# Patient Record
Sex: Female | Born: 1970 | Race: White | Hispanic: Yes | Marital: Married | Smoking: Never smoker
Health system: Southern US, Community
[De-identification: ages and names within clinical notes are randomized; demographics above are authoritative.]

## PROBLEM LIST (undated history)

## (undated) DIAGNOSIS — K219 Gastro-esophageal reflux disease without esophagitis: Secondary | ICD-10-CM

## (undated) DIAGNOSIS — G35 Multiple sclerosis: Secondary | ICD-10-CM

## (undated) DIAGNOSIS — H539 Unspecified visual disturbance: Secondary | ICD-10-CM

## (undated) DIAGNOSIS — R519 Headache, unspecified: Secondary | ICD-10-CM

## (undated) DIAGNOSIS — I10 Essential (primary) hypertension: Secondary | ICD-10-CM

## (undated) DIAGNOSIS — E7800 Pure hypercholesterolemia, unspecified: Secondary | ICD-10-CM

## (undated) DIAGNOSIS — R51 Headache: Secondary | ICD-10-CM

## (undated) HISTORY — DX: Unspecified visual disturbance: H53.9

## (undated) HISTORY — DX: Headache, unspecified: R51.9

## (undated) HISTORY — DX: Essential (primary) hypertension: I10

## (undated) HISTORY — DX: Headache: R51

## (undated) HISTORY — DX: Multiple sclerosis: G35

---

## 2005-06-25 ENCOUNTER — Ambulatory Visit (HOSPITAL_COMMUNITY): Admission: AD | Admit: 2005-06-25 | Discharge: 2005-06-25 | Payer: Self-pay | Admitting: Obstetrics and Gynecology

## 2005-06-26 ENCOUNTER — Inpatient Hospital Stay (HOSPITAL_COMMUNITY): Admission: AD | Admit: 2005-06-26 | Discharge: 2005-06-27 | Payer: Self-pay | Admitting: Obstetrics and Gynecology

## 2007-09-22 ENCOUNTER — Inpatient Hospital Stay (HOSPITAL_COMMUNITY): Admission: EM | Admit: 2007-09-22 | Discharge: 2007-09-25 | Payer: Self-pay | Admitting: Emergency Medicine

## 2007-09-23 ENCOUNTER — Encounter (INDEPENDENT_AMBULATORY_CARE_PROVIDER_SITE_OTHER): Payer: Self-pay | Admitting: *Deleted

## 2008-07-26 ENCOUNTER — Other Ambulatory Visit: Admission: RE | Admit: 2008-07-26 | Discharge: 2008-07-26 | Payer: Self-pay | Admitting: Obstetrics and Gynecology

## 2008-09-19 ENCOUNTER — Ambulatory Visit (HOSPITAL_COMMUNITY): Admission: RE | Admit: 2008-09-19 | Discharge: 2008-09-19 | Payer: Self-pay | Admitting: Obstetrics & Gynecology

## 2008-10-17 ENCOUNTER — Ambulatory Visit (HOSPITAL_COMMUNITY): Admission: RE | Admit: 2008-10-17 | Discharge: 2008-10-17 | Payer: Self-pay | Admitting: Obstetrics & Gynecology

## 2008-11-28 ENCOUNTER — Ambulatory Visit (HOSPITAL_COMMUNITY): Admission: RE | Admit: 2008-11-28 | Discharge: 2008-11-28 | Payer: Self-pay | Admitting: Obstetrics & Gynecology

## 2009-01-09 ENCOUNTER — Ambulatory Visit (HOSPITAL_COMMUNITY): Admission: RE | Admit: 2009-01-09 | Discharge: 2009-01-09 | Payer: Self-pay | Admitting: Obstetrics & Gynecology

## 2009-02-16 ENCOUNTER — Ambulatory Visit (HOSPITAL_COMMUNITY): Admission: RE | Admit: 2009-02-16 | Discharge: 2009-02-16 | Payer: Self-pay | Admitting: Obstetrics & Gynecology

## 2009-03-22 ENCOUNTER — Inpatient Hospital Stay (HOSPITAL_COMMUNITY): Admission: AD | Admit: 2009-03-22 | Discharge: 2009-03-25 | Payer: Self-pay | Admitting: Obstetrics & Gynecology

## 2009-07-10 ENCOUNTER — Ambulatory Visit (HOSPITAL_COMMUNITY): Payer: Self-pay | Admitting: Neurology

## 2009-07-10 ENCOUNTER — Encounter (HOSPITAL_COMMUNITY): Admission: RE | Admit: 2009-07-10 | Discharge: 2009-08-02 | Payer: Self-pay | Admitting: Neurology

## 2010-08-26 ENCOUNTER — Encounter: Payer: Self-pay | Admitting: Obstetrics & Gynecology

## 2010-09-18 ENCOUNTER — Encounter (HOSPITAL_COMMUNITY): Payer: 59 | Attending: Oncology

## 2010-09-18 ENCOUNTER — Ambulatory Visit (HOSPITAL_COMMUNITY): Payer: Self-pay

## 2010-09-18 ENCOUNTER — Ambulatory Visit (HOSPITAL_COMMUNITY): Payer: 59

## 2010-09-18 DIAGNOSIS — G35 Multiple sclerosis: Secondary | ICD-10-CM | POA: Insufficient documentation

## 2010-09-19 ENCOUNTER — Ambulatory Visit (HOSPITAL_COMMUNITY): Payer: 59

## 2010-09-20 ENCOUNTER — Ambulatory Visit (HOSPITAL_COMMUNITY): Payer: 59

## 2010-11-10 LAB — CBC
HCT: 37.8 % (ref 36.0–46.0)
Hemoglobin: 12.5 g/dL (ref 12.0–15.0)
MCHC: 33.1 g/dL (ref 30.0–36.0)
MCV: 89.4 fL (ref 78.0–100.0)
Platelets: 232 10*3/uL (ref 150–400)
RBC: 4.23 MIL/uL (ref 3.87–5.11)
RDW: 15.5 % (ref 11.5–15.5)
WBC: 12.6 10*3/uL — ABNORMAL HIGH (ref 4.0–10.5)

## 2010-11-10 LAB — RPR: RPR Ser Ql: NONREACTIVE

## 2010-12-18 NOTE — H&P (Signed)
[name], [name]               ACCOUNT NO.:  [id number]   MEDICAL RECORD NO.:  [id number]          PATIENT TYPE:  INP   LOCATION:  A201                          FACILITY:  [facility]   PHYSICIAN:  Mobolaji B. Bakare, M.D.DATE OF BIRTH:  04-26-1971   DATE OF ADMISSION:  09/22/2007  DATE OF DISCHARGE:  LH                              HISTORY & PHYSICAL   PRIMARY CARE PHYSICIAN:  Unassigned.   CHIEF COMPLAINT:  Right upper extremity weakness and numbness.   HISTORY OF PRESENTING COMPLAINT:  [name] is a 40 year old Spanish-  speaking lady.  History was obtained via the patient's husband which is  acceptable to the patient.   The patient was in her usual state of health.  She has no significant  past medical history nor is she on any chronic medications.  She was in  her usual state of health until last night about 11:00 p.m. when she  noted that her right hand was numb and felt to her face to be funny.  She did not think much of it.  She went to bed and slept. She woke up  feeling fine, but, while brushing her teeth, she noticed that the right  side of her face was not working properly.  As the morning progressed,  she again noticed numbness and weakness on the right upper extremity.  The face became numb as well. She had a sensation of pins and needles on  her right upper extremities.  She did not have any problem with her  lower extremity.  The patient, therefore, was brought to the emergency  room for evaluation. She had a CT scan of the head which showed left  basal ganglia 8 x 4 mm probable lacunar infarct, small area of subtle  microvascular subcortical ischemia, left centrum semiovale.  Initial  laboratory data were all within normal also including urine drug screen.   The patient's husband stated that she had similar symptoms about 2 years  ago which involved the right side of her face with numbness and  tingling, and this went away after 10 minutes.   REVIEW OF SYSTEMS:   She had she denies cough, fever, chills, shortness  of breath, chest pain, abdominal pain, nausea, vomiting, diarrhea.   PAST MEDICAL HISTORY:  None.   PAST SURGICAL HISTORY:  None.   FAMILY HISTORY:  Significant for hypertension.  No family history of  stroke.   SOCIAL HISTORY:  She does not smoke cigarettes nor drink alcohol.  She  denies drug abuse.   OB-GYN HISTORY:  She is para 5.  Last delivery was in November 2006.   CURRENT MEDICATIONS:  None.   ALLERGIES:  No known drug allergies.   PHYSICAL EXAMINATION:  VITAL SIGNS:  Initial temperature 97.8, blood  pressure 131/86, pulse of 89, respiratory rate of 18.  O2 saturation  100%.  NEUROLOGIC: On examination, the patient is awake, alert but not  communicating due to language barrier.  She appears removed from  environment.  There is no facial asymmetry.  She was able to follow  commands with instructions. She appears to  have what looks like  carpopedal spasm involving her right hand.  (Calcium is within normal  limits.)  She has no Chvostek sign.  NECK:  No elevated JVD.  No carotid bruits.  HEENT:  Mucous membranes moist.  No oral thrush.  LUNGS:  Clear clinically to auscultation.  CARDIOVASCULAR:  S1-S2 regular.  No murmur or gallop.  ABDOMEN:  Not distended, soft, nontender.  Bowel sounds present.  EXTREMITIES: No pedal edema or calf tenderness.  CNS:  The patient actually has no pronator drift.  However, there is  weakness of right upper extremity at 4-/5.  Remain extremities  5/5 in  all other limbs.  Cranial nerves II-XII intact except for diminished  sensation on right trigeminal nerve.  Ambulation: The patient has no  gait. Sensation:  Diminished sensation right upper extremity.  Coordination is intact.  MUSCULOSKELETAL:  No abnormalities.  SKIN: No rash or petechiae.   INITIAL LABORATORY DATA:  White cells 9.2, hemoglobin 13.2, platelets  324.  PT 12.6, INR 0.9, PTT 30.  Alcohol level less than 5.  Sodium  137,  potassium 4.0, chloride 104, CO2 22, glucose 95, BUN 11, creatinine  0.54, calcium 9.2. Pregnancy test negative.  Urine drug screen:  None  detected.  Urinalysis unremarkable.  Microscopy within normal limits.   ASSESSMENT AND PLAN:  [name] is a 40 year old Spanish-speaking lady  presenting with right upper extremity weakness and numbness, right  facial numbness.  She has no significant past medical history.  Husband  reported similar episode of right facial numbness about 2 years ago.   ADMISSION DIAGNOSIS:  Right upper extremity weakness and numbness, right  facial numbness. Rule out ischemic stroke. Rule out vasculitis. Rule out  multiple sclerosis. Brain lesion not seen on CT scan   PLAN:  Obtain MRI of the brain, MRA head and neck, carotid Dopplers, 2-D  echocardiogram, fasting lipid profile, hemoglobin A1c, homocysteine  level. Checked antinuclear antibody (ANA), sed rate and C-reactive  protein.  If  ANA is positive, will proceed with checking SLE panel.  Will monitor on telemetry.  Start aspirin 325 mg p.o. daily.  Will  obtain neurology consult. Will ask PT and OT to evaluate.  Will do  bedside swallowing evaluation. If she fails, will proceed with formal  swallowing evaluation by speech therapist.      Mobolaji B. Corky Downs, M.D.  Electronically Signed     MBB/MEDQ  D:  09/22/2007  T:  09/22/2007  Job:  [id number]

## 2010-12-18 NOTE — Procedures (Signed)
NAMELUCI, BELLUCCI               ACCOUNT NO.:  [id number]   MEDICAL RECORD NO.:  [id number]          PATIENT TYPE:  INP   LOCATION:  A201                          FACILITY:  [facility]   PHYSICIAN:  Dani Gobble, MD       DATE OF BIRTH:  08/26/1970   DATE OF PROCEDURE:  09/23/2007  DATE OF DISCHARGE:                                ECHOCARDIOGRAM   INDICATIONS:  A 40 year old patient admitted with a stroke.   The technical quality of the study is somewhat limited particularly from  the apical views.   The aorta appears grossly normal in size.   The left atrium subjectively is at the upper limits of normal in size.  The patient appeared to be in sinus rhythm during this procedure.   Interventricular septum and posterior wall are within normal limits in  thickness.   The aortic valve leaflets themselves are not well visualized but overall  leaflet excursion appeared normal.  No aortic insufficiency was noted.  Doppler interrogation aortic valve is within normal limits.   The mitral valve appeared structurally normal.  Mild mitral  regurgitation is noted.  Doppler interrogation of the mitral valve is  within normal limits.  No mitral valve prolapse is noted.   The pulmonic valve appeared grossly structurally normal.   The tricuspid valve also appeared grossly structurally normal with mild  to moderate tricuspid regurgitation noted.   The left ventricle was normal in size with LVIDD measured at 4.56 cm,  LVIC measured 3.6 cm.  Overall left ventricular systolic function was  diminished with an estimated ejection fraction of 40-50%.  There  appeared to be mild to moderate global hypokinesis with additional  moderate to severe mid to distal lateral wall hypokinesis.  The apical  wall motion was not well appreciated.   The right atrium and right ventricle are normal in size and right  ventricular systolic function was normal.  No obvious clots or masses  were appreciated but I cannot  exclude the possibility on this study.   IMPRESSION:  1. Left atrium is at the upper limits of normal.  2. Mild mitral and mild to moderate tricuspid regurgitation.  3. Normal left ventricular size with mild to moderate decrease in      ejection fraction estimated at 40-50% with mild      global hypokinesis and moderate to severe mid to distal lateral      wall hypokinesis.  4. No obvious clots or masses were appreciated and no obvious embolic      source, but I cannot exclude the possibility on this study.           ______________________________  Dani Gobble, MD     AB/MEDQ  D:  09/23/2007  T:  09/24/2007  Job:  [id number]

## 2010-12-18 NOTE — Discharge Summary (Signed)
NAMESKYLLAR, [name]               ACCOUNT NO.:  [id number]   MEDICAL RECORD NO.:  [id number]          PATIENT TYPE:  INP   LOCATION:  A201                          FACILITY:  [facility]   PHYSICIAN:  Skeet Latch, DO    DATE OF BIRTH:  08-Nov-1970   DATE OF ADMISSION:  09/22/2007  DATE OF DISCHARGE:  02/20/2009LH                               DISCHARGE SUMMARY   DISCHARGE DIAGNOSES:  1. Probable new onset of multiple sclerosis.  2. Hemiparesis and weakness, that is improving.   BRIEF HOSPITAL COURSE:  This is a 40 year old Hispanic female who  presented with her husband, secondary to language barrier, who has no  significant medical history of her any chronic medications, who  presented with right hand numbness and facial numbness 1 day prior.  The  patient woke up feeling fine.  While brushing her teeth, she noticed the  right side of her face was numb.  As the day went on, she noticed some  numbness and weakness of the right upper extremity and her face.  The  patient had sensation of pins and needles in her face and right upper  extremity.  After that, the patient was brought to the emergency room.  After being seen in the emergency room, a CT scan of her head showed a  left basal ganglia x4 mm parietal lacunar infarct, small areas of subtle  microvascular subcortical ischemia and a left central semiovale.  Carotid Dopplers of her neck showed no significant atherosclerosis.  She  subsequently underwent a MRI of her brain with and without contrast  which showed:  1. Greater than expected number or periventricular and subcortical T2      hyperintensities.  This included a lesion of left corona radiata      which measured 7 x 6-mm maximally.  The finding are nonspecific.      Can be seen in the setting of a demyelinating process.  Such      lesions can seen in setting of vasculitis, as sequelae of prior      infectious inflammatory processes.  2. Showed a 6.3-mm cystic lesion at the  level of the foramina of [place]      with a high signal of FLAIR images.  This is concerning for small      core-like cysts.  3. No evidence of acute or sub-cu infarct.  MRI angiography of her      head showed normal MRA circle of Willis.  MRI angiography of her      neck was unremarkable.  The patient's numbness and weakness has      slowly improved.  The patient had a neurologic consult and      underwent a spinal tap.  Fairly unremarkable findings on her spinal      tap.  The patient was having some muscle spasm was placed on Valium      p.o.  The patient was also placed on Solu-Medrol 1 gram daily x3      IV.  The patient has been doing fairly well and is wanting to go  home at this time.  I spoke with neurology, feel like patient, as      long as she received her Solu-Medrol, she can go home and have      follow-up as an outpatient in one week.   MEDICATIONS ON DISCHARGE:  Include Valium 5 mg 1 tablet p.o. t.i.d.  p.r.n.   PHYSICAL EXAMINATION:  VITALS:  On discharge:  Temperature is 97.9,  pulse 80, respirations 20, blood pressure 117/76.  She is satting 99% on  room air.   LABS:  Sodium 141, potassium 4.0, chloride 108, CO2 is 23, glucose 175,  BUN 20, creatinine 0.63, white count 16.1, hemoglobin 12.9, hematocrit  37.3, platelet count 336.  CSF culture had no organisms.  Cryptococcal  antigen was negative.   CONDITION ON DISCHARGE:  Stable.   DISPOSITION:  The patient be discharged to home with her husband.   DISCHARGE INSTRUCTIONS:  The patient is to maintain a regular diet.  She  is to increase activity slowly.  I do not feel the patient needs  physical therapy at this time. The patient is to follow up with Dr.  Gerilyn Pilgrim in 1 week and with her PCP in the next 1 to 2 weeks.  Of note,  the patient did have slightly-elevated cholesterol.  The patient needs  this monitored, to see if she may need a statin as an outpatient.  Will  defer to her primary care physician, due  to the muscle weakness at this  time.      Skeet Latch, DO  Electronically Signed     SM/MEDQ  D:  09/25/2007  T:  09/26/2007  Job:  [id number]   cc:   Darleen Crocker A. Gerilyn Pilgrim, M.D.  Fax: [phone]

## 2010-12-18 NOTE — Discharge Summary (Signed)
[name], [name]               ACCOUNT NO.:  [id number]   MEDICAL RECORD NO.:  [id number]          PATIENT TYPE:  INP   LOCATION:  9110                          FACILITY:  [facility]   PHYSICIAN:  Tilda Burrow, M.D. DATE OF BIRTH:  04-06-1971   DATE OF ADMISSION:  03/22/2009  DATE OF DISCHARGE:  03/25/2009                               DISCHARGE SUMMARY   ADMITTING DIAGNOSES:  Active labor, pregnancy [redacted] weeks gestation,  history of multiple sclerosis, questionable history of stroke, previous  history of group B strep, positive carrier status, desire for elective  sterilization in hospital.   DISCHARGE DIAGNOSES:  1. Pregnancy 40 weeks, delivered.  2. Multiple sclerosis, stable.  3. Questionable history of previous stroke ruled out, no evidence of      prior stroke found in records, desire for permanent sterilization,      the patient changed mind.  4. Desire for interval long-term contraception with Implanon.   PROCEDURES:  Epidural catheter placement, spontaneous vertex vaginal  delivery, Depo-Provera 150 mg IM prior to discharge.   HOSPITAL SUMMARY:  A 40 year old gravida 6, para 5-0-0-5 with [redacted] weeks  gestation presented with active labor after prenatal course followed at  [facility], and notable for a history of multiple sclerosis.  The patient began the pregnancy on gabapentin 300 mg b.i.d., Valium 10  mg b.i.d., interferon 40 mg 3 times per week, and ibuprofen which was  discontinued early in the pregnancy.  She had a previous history of  being followed by Dr. Beryle Beams for multiple sclerosis.   Pregnancy itself was noted for blood type O positive, antibody screen  negative, rubella immunity present, platelets 354,000, hemoglobin 12,  hematocrit 37.  Hepatitis, HIV, RPR, GC and chlamydia all negative.  MSAFP was declined.  Group B strep test was positive.  Glucose tolerance  test elevated at 153 mg percent with a normal 3-hour glucose tolerance  test Seph[employer]x  negative.  The patient did not follow serial glucoses, but  did not show glucosuria.   HOSPITAL COURSE:  The patient was admitted and proceeded to Labor.  She  progressed from admission to 5 cm at 5:00 a.m. and delivered at 8:00  a.m. of a healthy female infant, Apgars of 9 and 9 under epidural  analgesia without difficulty with 700 mL EBL.   Postpartum questions existed regarding tubal sterilization, which the  patient initially requested and then cancelled at the last minute.   More significantly, the question was raised as to whether the patient  has had a stroke.  She gave a positive verbal history.  Review of her  records was being initiated.  Old records from Dr. Gerilyn Pilgrim were  reviewed.  Dr. Beryle Beams, neurologist of .  She had been  seen in February 2007, by Dr. Gerilyn Pilgrim, at which time the diagnosis of  multiple sclerosis was made due to her presentation that showed facial  numbness, September 21, 2007 with right upper extremity weakness.  The  patient was hospitalized 3 days for this weakness and discharged on Solu-  Medrol and other medicines.  The patient's  old records from the  neurologist were acquired and reviewed to clarify the question of a  stroke.  The patient had, had a sleep study, which she was interpreting  as indicating a stroke.  After a thorough review of discharge summary  and subsequent records, it was determined that we could find no evidence  to document a history of stroke, and therefore, she was not placed on  thromboprophylaxis.  At the time of discharge, it has been extensively  emphasized to the patient the importance of proper followup with Dr.  Gerilyn Pilgrim in order to restart multiple sclerosis medications.  No  anticoagulation will be used at this time given the negative history of  stroke based on thorough records review.  The patient is asked to follow  up by [facility] in 4 weeks.  She will receive Depo-Provera  prior to discharge  for contraception and will be asked to see Dr.  Gerilyn Pilgrim this week to be reassessed for consideration of restarting her  multiple sclerosis medicines, which were discontinued for the duration  of the pregnancy.      Tilda Burrow, M.D.  Electronically Signed     JVF/MEDQ  D:  03/25/2009  T:  03/26/2009  Job:  [id number]   cc:   Darleen Crocker A. Gerilyn Pilgrim, M.D.  Fax: [phone]   [facility]

## 2010-12-18 NOTE — Group Therapy Note (Signed)
NAMEJOYCELIN, RADLOFF               ACCOUNT NO.:  [id number]   MEDICAL RECORD NO.:  [id number]          PATIENT TYPE:  INP   LOCATION:  A201                          FACILITY:  [facility]   PHYSICIAN:  Skeet Latch, DO    DATE OF BIRTH:  [birth year]   DATE OF PROCEDURE:  09/24/2007  DATE OF DISCHARGE:                                 PROGRESS NOTE   SUBJECTIVE:  [name] speaks Spanish, but apparently the patient is  doing well.  The patient just has had a spinal tap done by neurology  recently.  The patient seems to be awake and alert, not complaining of  any headaches and seems to be doing well at this point.   OBJECTIVE:  VITAL SIGNS:  Temperature is 97.3, pulse 85, respirations  16, blood pressure 106/75, satting 98% on room air.   PHYSICAL EXAM:  CARDIOVASCULAR:  Regular rate and rhythm.  No rubs,  gallops, murmurs.  LUNGS:  Clear to auscultation bilaterally.  No rales, rhonchi or  wheezing.  ABDOMEN:  Soft, nontender, nondistended.  No rigidity or guarding.  EXTREMITIES:  No clubbing, cyanosis or edema.  NEUROLOGIC:  She still has slight weakness to the right upper extremity,  but overall seems to be doing well. Still has some decreased sensation.   LABORATORY DATA:  Her cell count differential from a spinal tap: Color  is colorless. She had 4 WBCs, 66 RBCs. Her culture of CSF is pending.  Glucose was 88.  Her CSF total protein was 21, homocystine level was  5.9, sodium 139, potassium 3.9, chloride 108, CO2 21, glucose 137, BUN  19, creatinine 0.61, white count 10.71, hemoglobin 13.7, hematocrit  39.5, platelets 331.   ASSESSMENT/PLAN:  Probable multiple sclerosis. The patient just received  a spinal tap.  Will await neurology review of her spinal tap results at  this time.  At this time, will continue current treatment.  Will  continue to follow.  Await neurology recommendations status post her  spinal tap.      Skeet Latch, DO  Electronically Signed     SM/MEDQ   D:  09/24/2007  T:  09/24/2007  Job:  [id number]

## 2010-12-18 NOTE — Consult Note (Signed)
[name], [name]               ACCOUNT NO.:  [id number]   MEDICAL RECORD NO.:  [id number]          PATIENT TYPE:  INP   LOCATION:  A201                          FACILITY:  [facility]   PHYSICIAN:  Kofi A. Gerilyn Pilgrim, M.D. DATE OF BIRTH:  [birth year]   DATE OF CONSULTATION:  DATE OF DISCHARGE:                                 CONSULTATION   REASON FOR CONSULTATION:  Right-sided weakness.   The patient is a 40 year old Mexican-American female who presents with  the acute onset of right facial numbness and right upper extremity  numbness.  She also had weakness and what appears to be flexor spasms of  the right hand.  The patient's initial reports about this was that this  was the first time this has happened, but the husband now indicates that  about 2-3 years ago she had a similar event.  This time, the event  seemed to have lasted much longer, as she is now 1-2 days into the  current event.  The first event was about three years ago and lasted  about 10 minutes, and apparently was precipitated by psychosocial  stressors.  She does not report having symptoms involving the left side.  She also does endorse having some blurred vision on the right with the  right facial numbness.   PAST MEDICAL HISTORY:  The patient has essentially been relatively  healthy.   ADMISSION MEDICATIONS:  None.   SOCIAL HISTORY:  Married.   REVIEW OF SYSTEMS:  As stated in history of present illness, otherwise  unrevealing.   PHYSICAL EXAMINATION:  A pleasant lady, anxious-appearing, no acute  distress.  Temperature 97.9, pulse 85, respirations 20, blood pressure 113/71.  HEENT:  Head is normocephalic and atraumatic.  NECK:  Supple.  ABDOMEN:  Soft.  EXTREMITIES:  No significant edema.  MENTATION:  The patient is awake and alert.  She does follow commands.  She speaks very little [place].  Cranial nerve evaluation:  Pupils are  equal, round and reactive to light and accommodation.  Extraocular  movements  are intact.  Facial muscle strength is symmetric.  Tongue is  midline.  Uvula is midline.  Shoulder shrug is normal on the left but  impaired on the right.  Motor examination shows 4-5 weakness involving  the proximal right upper extremity, 3-5 involving the distal right upper  extremity.  The hand is contracted and has increased tone.  Normal  strength in the right leg, normal strength in the left side.  Sensation  is normal to light touch and temperature, as far as we can tell,  although there is some limitation with language, but the husband, who  interprets, states that she does not report any impaired sensation.  Reflexes are symmetric, although plantar reflexes are upgoing on the  right side.  The reflexes appear to be normal to slightly brisk.  Coordination indicates no dysmetrias or tremors.   MRI of the brain was reviewed in person and shows multiple scattered  white matter plaques.  She has several plaques, especially involving the  left hemisphere that are perpendicular to the corpus callosum.  She has  more plaques involving the left hemisphere.  The plaques are counted,  and she has approximately 20 different lesions.  MRA of the brain and  carotids are negative.  She does have an incidental fluid-filled lesion  that is at the top of the third ventricle, that is undoubtedly a colloid  cyst.  This is an incidental finding.   LABORATORY EVALUATION:  Sed rate 20.  Urinalysis negative.  Urine drug  screen negative.  Ethanol level negative.  WBC 9.2, hemoglobin 13,  platelet count 324.  Sodium 137, potassium 4, chloride 104, CO2 22, BUN  11, creatinine 0.5, glucose 95, calcium 9.2.   IMPRESSION:  1. Acute onset of left hemiparesis and spasms with multiple white      matter lesions.  The most likely diagnosis is new onset of multiple      sclerosis.  She actually had similar event three years ago but      lasted only 10 minutes.  Other differentials are unlikely,      including  vasculitis, migraines, and chronic ischemia.  There is      nothing acute seen on diffusion imaging to indicate an acute      infarct.  2. Incidental colloid cyst.   RECOMMENDATIONS:  1. Solu-Medrol per the typical protocol, 1 gm daily x3 days.  2. Spinal tap.  3. Will start her on Valium to help with the spasms.  4. She had some additional labs which were ordered, and those will be      checked.  Some of those labs include ANA.   Thanks for this consultation.      Kofi A. Gerilyn Pilgrim, M.D.  Electronically Signed     KAD/MEDQ  D:  09/23/2007  T:  09/23/2007  Job:  [id number]

## 2010-12-18 NOTE — Group Therapy Note (Signed)
[name], OSWALD               ACCOUNT NO.:  [id number]   MEDICAL RECORD NO.:  [id number]          PATIENT TYPE:  INP   LOCATION:  A201                          FACILITY:  [facility]   PHYSICIAN:  Skeet Latch, DO    DATE OF BIRTH:  [birth year]   DATE OF PROCEDURE:  09/23/2007  DATE OF DISCHARGE:                                 PROGRESS NOTE   SUBJECTIVE:  The patient seems to be slightly improved today.  The  patient continues to have a right upper extremity weakness and numbness  but secondary to language barrier, unsure if it is improved.  Husband  seems to think that this has improved since yesterday, but only  slightly.   OBJECTIVE:  VITAL SIGNS:  Temperature is 98.3, pulse 77, respirations  18, blood pressure 103/55, sating 98% on room air.  GENERAL:  The patient is awake and alert.  No acute distress.  Seems to  be doing well.  LUNGS:  Clear to auscultation bilaterally.  No rales, rhonchi or  wheezing.  CARDIOVASCULAR:  Normal rate and rhythm.  No rubs, gallops or murmurs.  ABDOMEN:  Soft, nontender, nondistended.  Positive bowel sounds.  EXTREMITIES:  No clubbing, cyanosis or edema.  NEUROLOGIC:  She has some weakness in her right upper extremity.  She is  +4/5.  Cranial nerves II-XII seem grossly intact.  She has some  decreased sensation in the right trigeminal nerve distribution.  The  patient is lying in bed, could not get an assessment of her gait.  SKIN:  No obvious rashes or pruritus is noted.   LABORATORY DATA:  Lipid panel:  Cholesterol 218, triglycerides 124, HDL  56, LDL 137.  Sed rate is 20.   Chest x-ray showed linear submental atelectasis at the left lung base,  otherwise unremarkable.   ASSESSMENT/PLAN:  Probable multiple sclerosis.  The patient being  followed by neurology.  Incidental __________ cyst was also found.  Neurology plans on doing a spinal tap.  The patient will continue on IV  Solu-Medrol and has been given Valium for any spasms.  Carotid   Dopplers  and 2-D echo are pending at this time.  Will continue to follow the  patient very closely.      Skeet Latch, DO  Electronically Signed     SM/MEDQ  D:  09/23/2007  T:  09/23/2007  Job:  [id number]

## 2010-12-21 NOTE — H&P (Signed)
NAMEMARGRETTE, [name]               ACCOUNT NO.:  [id number]   MEDICAL RECORD NO.:  [id number]          PATIENT TYPE:  INP   LOCATION:  A412                          FACILITY:  [facility]   PHYSICIAN:  Tilda Burrow, M.D. DATE OF BIRTH:  1971-08-02   DATE OF ADMISSION:  DATE OF DISCHARGE:  LH                                HISTORY & PHYSICAL   ADMITTING DIAGNOSIS:  Pregnancy, 40 weeks, 1 day, early labor.   HISTORY OF PRESENT ILLNESS:  This 40 year old Hispanic female, gravida 5,  para 4, four living children, LMP September 18, 2004, [facility] June 25, 2005,  with corresponding second and first trimester ultrasounds, who is admitted  at 40 weeks, 1 day, after presenting to the office in 12 hours of mild  discomfort with exam showing the cervix to be 4 cm, 20% effaced, very  posteriorly oriented, vertex presentation, well applied to the lower uterine  segment.  There is no bleeding. The membranes appear to be intact.  Yesterday, she has a non-stress test that was reactive.  She was advised to  present to labor and delivery for probable vaginal delivery.  She plans for  this to be her last child.  Final contraception plans, IUD versus pills  versus tubal, have not been finalized.   PAST MEDICAL HISTORY:  Benign surgical history, negative allergies known.   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   PRENATAL LABORATORY DATA:  Blood type 0 positive.  Urine drug screen  negative.  Hemoglobin 13, hematocrit 42.  Hepatitis, HIV negative. RPR  nonreactive. GC and Chlamydia negative.  MSAFP negative at 1:1900 risk of  Down syndrome.  Glucose tolerance 100 mg%.  Her husband, Kern Alberta, is with her  and speaks good [place].  Prenatal course has been notable for severe  urticarial reaction of pregnancy involving hands, forearms, and face during  July and August, improved with 1% hydrocortisone.   PHYSICAL EXAMINATION:   GENERAL:  Weight 169, blood pressure 118/80, term  size fetus, vertex presentation, cervix 4 cm, 20%, -2, vertex posteriorly  oriented cervix.   PLAN:  Allow to labor spontaneously, IV analgesics.      Tilda Burrow, M.D.  Electronically Signed     JVF/MEDQ  D:  06/26/2005  T:  06/26/2005  Job:  [id number]

## 2010-12-21 NOTE — H&P (Signed)
NAMEJASMINEMARIE, [name]               ACCOUNT NO.:  [id number]   MEDICAL RECORD NO.:  [id number]          PATIENT TYPE:  INP   LOCATION:  LDR4                          FACILITY:  [facility]   PHYSICIAN:  Tilda Burrow, M.D. DATE OF BIRTH:  [birth year]   DATE OF ADMISSION:  06/26/2005  DATE OF DISCHARGE:  LH                                HISTORY & PHYSICAL   ADMISSION DIAGNOSES:  Pregnancy at 40 weeks and 1 day in active labor.   HISTORY OF PRESENT ILLNESS:  Ms. Sandi Mariscal presented to the office complaining  of irregular uterine contractions. Yesterday, the patient's cervix was  closed. Today, she is 4 cm and irregular contractions were noted.   PAST MEDICAL HISTORY:  Negative.   PAST SURGICAL HISTORY:  Negative.   ALLERGIES:  No known drug allergies.   PRENATAL COURSE:  Uneventful. Blood type O positive. UDS negative. Rubella  is immune. Hepatitis B surface antigen negative. HIV is nonreactive. HSV  type 1 is positive, type 2 negative. Serologies nonreactive. Pap is normal.  GC and chlamydia were negative and repeats were also negative. A 28-week  hemoglobin 11.4, 28-week hematocrit 35.7. One-hour glucose 100. Sickle cell  screen negative.   PHYSICAL EXAMINATION:  Vital signs are stable. The patient is 4 cm, vertex  presentation is noted, 80% effaced, -1 station.   PLAN:  Admit. Expect vaginal delivery.      Zerita Boers, Lanier Clam      Tilda Burrow, M.D.  Electronically Signed    DL/MEDQ  D:  16/05/9603  T:  06/26/2005  Job:  [id number]   cc:   [facility]

## 2010-12-21 NOTE — Op Note (Signed)
[name], [name]               ACCOUNT NO.:  [id number]   MEDICAL RECORD NO.:  [id number]          PATIENT TYPE:  INP   LOCATION:  LDR4                          FACILITY:  [facility]   PHYSICIAN:  Tilda Burrow, M.D. DATE OF BIRTH:  March 26, 1971   DATE OF PROCEDURE:  DATE OF DISCHARGE:                                  PROCEDURE NOTE   DELIVERY SUMMARY:   ONSET OF LABOR:  June 26, 2005 at 9 a.m.   DATE OF DELIVERY:  June 26, 2005 at 1546 p.m.   LENGTH OF FIRST STAGE OF LABOR:  6 hours, 26 minutes.   LENGTH OF SECOND STAGE OF LABOR:  20 minutes.   LENGTH OF THIRD STAGE OF LABOR:  8 minutes.   DELIVERY NOTE:  [name] had a normal spontaneous vaginal delivery of a viable  female infant.  On delivery of head, there was noted an __Nuchal cord x 1__  which was easily reduced, and the infant delivered spontaneously without  difficulty.  Upon delivery, the infant was thoroughly suctioned, dried, cord  clamped and cut, and passed off the nurses for newborn care.  Apgars were  9/9.  Upon inspection, there was a second degree perineal laceration noted,  which was infiltrated with 20 cc of 1% lidocaine and repaired with 2-0  Vicryl with 2 interrupted sutures and subcutaneous to close.  The third  stage of labor was actually managed with 20 units of Pitocin and a 1000 cc  of __Lactated Ringers solution___ at a rapid rate.  Placenta was delivered  spontaneously via Schultz's mechanism.  Upon inspection, there was a three vessel cord noted, and membranes were  noted to be intact.  Estimated blood loss approximately 300 cc.  The infant  and motor were both stabilized and transferred out to the postpartum unit in  stable condition.      Zerita Boers, Lanier Clam      Tilda Burrow, M.D.  Electronically Signed    DL/MEDQ  D:  16/05/9603  T:  06/26/2005  Job:  [id number]   cc:   Jeoffrey Massed, MD  Fax: [phone]

## 2011-03-11 ENCOUNTER — Other Ambulatory Visit: Payer: Self-pay | Admitting: Neurology

## 2011-03-11 DIAGNOSIS — R531 Weakness: Secondary | ICD-10-CM

## 2011-03-18 ENCOUNTER — Ambulatory Visit (HOSPITAL_COMMUNITY)
Admission: RE | Admit: 2011-03-18 | Discharge: 2011-03-18 | Disposition: A | Discharge status: Home / Self Care | Payer: 59 | Source: Ambulatory Visit | Attending: Neurology | Admitting: Neurology

## 2011-03-18 DIAGNOSIS — R209 Unspecified disturbances of skin sensation: Secondary | ICD-10-CM | POA: Insufficient documentation

## 2011-03-18 DIAGNOSIS — G35 Multiple sclerosis: Secondary | ICD-10-CM | POA: Insufficient documentation

## 2011-03-18 DIAGNOSIS — M6281 Muscle weakness (generalized): Secondary | ICD-10-CM | POA: Insufficient documentation

## 2011-03-18 DIAGNOSIS — R531 Weakness: Secondary | ICD-10-CM

## 2011-03-18 MED ORDER — GADOBENATE DIMEGLUMINE 529 MG/ML IV SOLN: 15.0000 mL | Freq: Once | INTRAVENOUS | Status: AC | PRN

## 2011-04-26 LAB — LIPID PANEL
Cholesterol: 218 — ABNORMAL HIGH
HDL: 56
LDL Cholesterol: 137 — ABNORMAL HIGH
Total CHOL/HDL Ratio: 3.9
Triglycerides: 124
VLDL: 25

## 2011-04-26 LAB — BASIC METABOLIC PANEL
BUN: 11
BUN: 19
BUN: 20
CO2: 21
CO2: 22
CO2: 23
Calcium: 9.2
Calcium: 9.4
Calcium: 9.6
Chloride: 104
Chloride: 108
Chloride: 108
Creatinine, Ser: 0.54
Creatinine, Ser: 0.61
Creatinine, Ser: 0.63
GFR calc Af Amer: >60
GFR calc Af Amer: >60
GFR calc Af Amer: >60
GFR calc non Af Amer: >60
GFR calc non Af Amer: >60
GFR calc non Af Amer: >60
Glucose, Bld: 137 — ABNORMAL HIGH
Glucose, Bld: 175 — ABNORMAL HIGH
Glucose, Bld: 95
Potassium: 3.9
Potassium: 4
Potassium: 4
Sodium: 137
Sodium: 139
Sodium: 141

## 2011-04-26 LAB — TSH: TSH: 2.547

## 2011-04-26 LAB — URINALYSIS, ROUTINE W REFLEX MICROSCOPIC
Bilirubin Urine: NEGATIVE
Glucose, UA: NEGATIVE
Ketones, ur: NEGATIVE
Leukocytes, UA: NEGATIVE
Nitrite: NEGATIVE
Protein, ur: NEGATIVE
Specific Gravity, Urine: 1.02
Urobilinogen, UA: 0.2
pH: 5.5

## 2011-04-26 LAB — DIFFERENTIAL
Basophils Absolute: 0
Basophils Absolute: 0
Basophils Absolute: 0.1
Basophils Relative: 0
Basophils Relative: 0
Basophils Relative: 1
Eosinophils Absolute: 0
Eosinophils Absolute: 0
Eosinophils Absolute: 0
Eosinophils Relative: 0
Eosinophils Relative: 0
Eosinophils Relative: 1
Lymphocytes Relative: 24
Lymphocytes Relative: 5 — ABNORMAL LOW
Lymphocytes Relative: 8 — ABNORMAL LOW
Lymphs Abs: 0.8
Lymphs Abs: 0.9
Lymphs Abs: 2.2
Monocytes Absolute: 0 — ABNORMAL LOW
Monocytes Absolute: 0.2
Monocytes Absolute: 0.3
Monocytes Relative: 0 — ABNORMAL LOW
Monocytes Relative: 2 — ABNORMAL LOW
Monocytes Relative: 2 — ABNORMAL LOW
Neutro Abs: 14.9 — ABNORMAL HIGH
Neutro Abs: 6.7
Neutro Abs: 9.8 — ABNORMAL HIGH
Neutrophils Relative %: 73
Neutrophils Relative %: 92 — ABNORMAL HIGH
Neutrophils Relative %: 93 — ABNORMAL HIGH

## 2011-04-26 LAB — OLIGOCLONAL BANDS, CSF + SERM
Albumin Index: 1.5 ratio (ref 0.0–9.0)
Albumin, CSF: 6 mg/dL (ref 0–35)
Albumin, Serum(Neph): 4070 mg/dL (ref 3500–5200)
CSF Oligoclonal Bands: NEGATIVE
IgG Index, CSF: 0.58 ratio (ref 0.28–0.66)
IgG, CSF: 1.1 mg/dL (ref 0.0–6.0)
IgG, Serum: 1280 mg/dL (ref 768–1632)
IgG/Albumin Ratio, CSF: 0.18 ratio (ref 0.09–0.25)
MS CNS IgG Synthesis Rate: 0 mg/d (ref 0.0–8.0)

## 2011-04-26 LAB — CBC
HCT: 37.3
HCT: 38.5
HCT: 39.5
Hemoglobin: 12.9
Hemoglobin: 13.2
Hemoglobin: 13.7
MCHC: 34.3
MCHC: 34.6
MCHC: 34.7
MCV: 88.4
MCV: 88.5
MCV: 88.9
Platelets: 324
Platelets: 331
Platelets: 336
RBC: 4.22
RBC: 4.36
RBC: 4.45
RDW: 13.4
RDW: 13.5
RDW: 13.5
WBC: 10.7 — ABNORMAL HIGH
WBC: 16.1 — ABNORMAL HIGH
WBC: 9.2

## 2011-04-26 LAB — RAPID URINE DRUG SCREEN, HOSP PERFORMED
Amphetamines: NOT DETECTED
Barbiturates: NOT DETECTED
Benzodiazepines: NOT DETECTED
Cocaine: NOT DETECTED
Opiates: NOT DETECTED
Tetrahydrocannabinol: NOT DETECTED

## 2011-04-26 LAB — FUNGUS CULTURE W SMEAR: Fungal Smear: NONE SEEN

## 2011-04-26 LAB — HEMOGLOBIN A1C
Hgb A1c MFr Bld: 5.6
Mean Plasma Glucose: 122

## 2011-04-26 LAB — PROTEIN AND GLUCOSE, CSF
Glucose, CSF: 88 — ABNORMAL HIGH
Total  Protein, CSF: 21

## 2011-04-26 LAB — CSF CELL COUNT WITH DIFFERENTIAL
RBC Count, CSF: 56 — ABNORMAL HIGH
Tube #: 4
WBC, CSF: 4

## 2011-04-26 LAB — CSF CULTURE W GRAM STAIN
Culture: NO GROWTH
Gram Stain: NONE SEEN

## 2011-04-26 LAB — PROTIME-INR
INR: 0.9
Prothrombin Time: 12.6

## 2011-04-26 LAB — URINE MICROSCOPIC-ADD ON

## 2011-04-26 LAB — CRYPTOCOCCAL ANTIGEN, CSF: Crypto Ag: NEGATIVE

## 2011-04-26 LAB — ETHANOL: Alcohol, Ethyl (B): <5

## 2011-04-26 LAB — HOMOCYSTEINE: Homocysteine: 5.9

## 2011-04-26 LAB — PREGNANCY, URINE: Preg Test, Ur: NEGATIVE

## 2011-04-26 LAB — ANA: Anti Nuclear Antibody(ANA): NEGATIVE

## 2011-04-26 LAB — APTT: aPTT: 30

## 2011-04-26 LAB — SEDIMENTATION RATE: Sed Rate: 20

## 2011-04-26 LAB — C-REACTIVE PROTEIN: CRP: 2.2 — ABNORMAL HIGH (ref <0.6)

## 2011-06-18 ENCOUNTER — Ambulatory Visit (HOSPITAL_COMMUNITY): Payer: 59

## 2011-06-18 ENCOUNTER — Encounter (HOSPITAL_COMMUNITY): Payer: 59 | Attending: Neurology

## 2011-06-18 DIAGNOSIS — G35 Multiple sclerosis: Secondary | ICD-10-CM

## 2011-06-18 MED ORDER — METHYLPREDNISOLONE SODIUM SUCC 125 MG IJ SOLR: 1000.0000 mg | INTRAMUSCULAR | Status: DC

## 2011-06-18 MED ORDER — SODIUM CHLORIDE 0.9 % IJ SOLN: 10.0000 mL | INTRAMUSCULAR | Status: DC | PRN

## 2011-06-18 MED ORDER — SODIUM CHLORIDE 0.9 % IV SOLN
Freq: Once | INTRAVENOUS | Status: AC
  Administered 2011-06-18: 13:00:00 via INTRAVENOUS
  Filled 2011-06-18: qty 16

## 2011-06-18 MED ORDER — HEPARIN SOD (PORK) LOCK FLUSH 100 UNIT/ML IV SOLN
500.0000 [IU] | Freq: Once | INTRAVENOUS | Status: AC
  Administered 2011-06-18: 200 [IU] via INTRAVENOUS

## 2011-06-18 MED ORDER — SODIUM CHLORIDE 0.9 % IV SOLN
INTRAVENOUS | Status: DC
  Administered 2011-06-18: 13:00:00 via INTRAVENOUS

## 2011-06-18 NOTE — Progress Notes (Signed)
Tolerated infusion well. 

## 2011-06-19 ENCOUNTER — Ambulatory Visit (HOSPITAL_COMMUNITY): Payer: 59

## 2011-06-19 ENCOUNTER — Encounter (HOSPITAL_COMMUNITY): Payer: 59

## 2011-06-19 ENCOUNTER — Encounter (HOSPITAL_COMMUNITY): Payer: Self-pay

## 2011-06-19 DIAGNOSIS — G35 Multiple sclerosis: Secondary | ICD-10-CM | POA: Insufficient documentation

## 2011-06-19 HISTORY — DX: Multiple sclerosis: G35

## 2011-06-19 MED ORDER — HEPARIN SOD (PORK) LOCK FLUSH 100 UNIT/ML IV SOLN
500.0000 [IU] | Freq: Once | INTRAVENOUS | Status: AC
  Administered 2011-06-19: 200 [IU] via INTRAVENOUS

## 2011-06-19 MED ORDER — METHYLPREDNISOLONE SODIUM SUCC 125 MG IJ SOLR: 1000.0000 mg | Freq: Once | INTRAMUSCULAR | Status: DC

## 2011-06-19 MED ORDER — SODIUM CHLORIDE 0.9 % IJ SOLN
10.0000 mL | INTRAMUSCULAR | Status: DC | PRN
  Administered 2011-06-19: 10 mL via INTRAVENOUS

## 2011-06-19 MED ORDER — SODIUM CHLORIDE 0.9 % IV SOLN
Freq: Once | INTRAVENOUS | Status: AC
  Administered 2011-06-19: 14:00:00 via INTRAVENOUS
  Filled 2011-06-19: qty 8

## 2011-06-19 NOTE — Progress Notes (Signed)
Tolerated infusion well. 

## 2011-06-20 ENCOUNTER — Encounter (HOSPITAL_COMMUNITY): Payer: 59

## 2011-06-20 ENCOUNTER — Ambulatory Visit (HOSPITAL_COMMUNITY): Payer: 59

## 2011-06-20 DIAGNOSIS — G35 Multiple sclerosis: Secondary | ICD-10-CM

## 2011-06-20 MED ORDER — METHYLPREDNISOLONE SODIUM SUCC 500 MG IJ SOLR: 1000.0000 mg | INTRAMUSCULAR | Status: DC

## 2011-06-20 MED ORDER — SODIUM CHLORIDE 0.9 % IV SOLN
INTRAVENOUS | Status: DC
  Administered 2011-06-20: 14:00:00 via INTRAVENOUS

## 2011-06-20 MED ORDER — HEPARIN SOD (PORK) LOCK FLUSH 100 UNIT/ML IV SOLN
500.0000 [IU] | Freq: Once | INTRAVENOUS | Status: AC
  Administered 2011-06-20: 500 [IU] via INTRAVENOUS

## 2011-06-20 MED ORDER — SODIUM CHLORIDE 0.9 % IV SOLN
Freq: Once | INTRAVENOUS | Status: AC
  Administered 2011-06-20: 15:00:00 via INTRAVENOUS
  Filled 2011-06-20: qty 8

## 2011-06-20 MED ORDER — SODIUM CHLORIDE 0.9 % IJ SOLN
10.0000 mL | INTRAMUSCULAR | Status: DC | PRN
  Administered 2011-06-20: 10 mL via INTRAVENOUS

## 2011-06-20 NOTE — Progress Notes (Signed)
Tolerated infusion well. 

## 2011-06-21 ENCOUNTER — Encounter (HOSPITAL_COMMUNITY): Payer: 59

## 2011-06-21 ENCOUNTER — Ambulatory Visit (HOSPITAL_COMMUNITY): Payer: 59

## 2011-06-21 VITALS — BP 124/77 | HR 69 | Temp 97.7°F

## 2011-06-21 DIAGNOSIS — G35 Multiple sclerosis: Secondary | ICD-10-CM

## 2011-06-21 MED ORDER — METHYLPREDNISOLONE SODIUM SUCC 125 MG IJ SOLR: 1000.0000 mg | Freq: Once | INTRAMUSCULAR | Status: DC

## 2011-06-21 MED ORDER — SODIUM CHLORIDE 0.9 % IV SOLN
Freq: Once | INTRAVENOUS | Status: AC
  Administered 2011-06-21: 15:00:00 via INTRAVENOUS
  Filled 2011-06-21: qty 8

## 2011-06-21 MED ORDER — SODIUM CHLORIDE 0.9 % IJ SOLN
10.0000 mL | INTRAMUSCULAR | Status: DC | PRN
  Administered 2011-06-21: 10 mL via INTRAVENOUS

## 2011-06-21 MED ORDER — SODIUM CHLORIDE 0.9 % IV SOLN
INTRAVENOUS | Status: DC
  Administered 2011-06-21: 15:00:00 via INTRAVENOUS

## 2011-06-21 NOTE — Progress Notes (Signed)
Tolerated infusion well. 

## 2011-06-24 ENCOUNTER — Ambulatory Visit (HOSPITAL_COMMUNITY): Payer: 59

## 2012-03-19 ENCOUNTER — Other Ambulatory Visit (HOSPITAL_COMMUNITY): Payer: Self-pay | Admitting: *Deleted

## 2012-03-19 DIAGNOSIS — Z139 Encounter for screening, unspecified: Secondary | ICD-10-CM

## 2012-03-24 ENCOUNTER — Observation Stay (HOSPITAL_COMMUNITY): Payer: 59

## 2014-10-21 ENCOUNTER — Other Ambulatory Visit: Payer: Self-pay | Admitting: Family Medicine

## 2014-10-21 DIAGNOSIS — R519 Headache, unspecified: Secondary | ICD-10-CM

## 2014-10-21 DIAGNOSIS — G35 Multiple sclerosis: Secondary | ICD-10-CM

## 2014-10-21 DIAGNOSIS — R51 Headache: Secondary | ICD-10-CM

## 2014-10-25 ENCOUNTER — Ambulatory Visit (INDEPENDENT_AMBULATORY_CARE_PROVIDER_SITE_OTHER): Payer: 59 | Admitting: Neurology

## 2014-10-25 ENCOUNTER — Encounter: Payer: Self-pay | Admitting: Neurology

## 2014-10-25 ENCOUNTER — Telehealth: Payer: Self-pay | Admitting: Neurology

## 2014-10-25 VITALS — BP 124/68 | HR 72 | Temp 98.2°F | Resp 16 | Ht 60.0 in | Wt 147.5 lb

## 2014-10-25 DIAGNOSIS — F329 Major depressive disorder, single episode, unspecified: Secondary | ICD-10-CM

## 2014-10-25 DIAGNOSIS — F32A Depression, unspecified: Secondary | ICD-10-CM

## 2014-10-25 DIAGNOSIS — G35 Multiple sclerosis: Secondary | ICD-10-CM

## 2014-10-25 MED ORDER — DIAZEPAM 10 MG PO TABS: 10.0000 mg | ORAL_TABLET | Freq: Once | ORAL | Status: DC

## 2014-10-25 MED ORDER — CITALOPRAM HYDROBROMIDE 10 MG PO TABS: 10.0000 mg | ORAL_TABLET | Freq: Every day | ORAL | Status: DC

## 2014-10-25 MED ORDER — SODIUM CHLORIDE 0.9 % IV SOLN: 1000.0000 mg | INTRAVENOUS | Status: DC

## 2014-10-25 NOTE — Addendum Note (Signed)
Addended by: Glean Salen E on: 10/25/2014 09:52 AM   Modules accepted: Orders

## 2014-10-25 NOTE — Progress Notes (Addendum)
NEUROLOGY CONSULTATION NOTE  [name] MRN: [id number] DOB: March 11, 1971  Referring provider: Dr. Merlene Laughter Primary care provider: Dr. Ernie Hew  Reason for consult:  MS  HISTORY OF PRESENT ILLNESS: [name] is a 44 year old right-handed Spanish-speaking woman who presents to establish care for multiple sclerosis.  Records from PCP, MRIs from 2009 and 2012, and labs reviewed.  She is accompanied by her daughter who provides some history.  Interpretor is present.  She was diagnosed with multiple sclerosis in 2009.  At that time, she developed right sided numbness of the face, arm and leg.  There was also associated weakness involving the arm and leg as well.  There was associated bilateral blurred vision.  There was no associated headache.  It lasted 3 or 4 days.  At first, it was believed she had a stroke.  However, MRI of the brain with and without contrast showed numerous non-specific periventricular and subcortical hyperintensities, greater than expected for age.  There was no abnormal enhancement.  She underwent lumbar puncture in February 2009.  CSF cell count was 4, protein 21, glucose 88, IgG index within normal limits and no bands.  Serum ANA and Sed Rate were negative.  She was treated in the hospital with IV Solu-Medrol  She was followed by a neurologist, Dr. Merlene Laughter.  Initially she was taking Rebif, which worked well.  However, it caused injection site reaction.  Around 2014, she was switched to [place].  She was taking Tecfedera until about 5 months ago.  She changed insurance and couldn't afford it.  About 4 days ago, she developed the right sided numbness and tingling of face, arm and leg.  There is some weakness involving the arm and leg.  It was associated with slight posterior headache.    Between 2009 and now, she had two other similar episodes, requiring IV Solu-Medrol.  Most recent MRI of the brain with and without contrast available for review is from 03/19/11 and  personally reviewed.  It shows multiple supratentorial white matter hyperintensities without evidence of acute demyelination.  It mentions that it appears stable from prior imaging from 2009.  Recent labs from 10/21/14 show WBC 7.8, absolute lymp 1.8,  Hgb 14.1, HCT 44, PLT 412, 70% Neut, total protein 7.5, albumin 4.6, Alk Phos 77, AST 13, ALT 11, vitamin D 25-hydroxy 11.6.  She was started on vitzamin D 50,000 units weekly.  She has no personal history of migraine.  She has no family history of MS.  She notes depression.  She was previously on citalopram, which was helpful.  PAST MEDICAL HISTORY: Past Medical History  Diagnosis Date  . Multiple sclerosis 06/19/2011  . Multiple sclerosis 06/19/2011  . Headache     PAST SURGICAL HISTORY: No past surgical history on file.  MEDICATIONS: No current outpatient prescriptions on file prior to visit.   No current facility-administered medications on file prior to visit.    ALLERGIES: Allergies not on file  FAMILY HISTORY: Family History  Problem Relation Age of Onset  . Hypertension Mother     SOCIAL HISTORY: History   Social History  . Marital Status: Married    Spouse Name: N/A  . Number of Children: N/A  . Years of Education: N/A   Occupational History  . Not on file.   Social History Main Topics  . Smoking status: Never Smoker   . Smokeless tobacco: Never Used  . Alcohol Use: No  . Drug Use: No  . Sexual Activity:  Partners: Male   Other Topics Concern  . Not on file   Social History Narrative    REVIEW OF SYSTEMS: Constitutional: No fevers, chills, or sweats, no generalized fatigue, change in appetite Eyes: No visual changes, double vision, eye pain Ear, nose and throat: No hearing loss, ear pain, nasal congestion, sore throat Cardiovascular: No chest pain, palpitations Respiratory:  No shortness of breath at rest or with exertion, wheezes GastrointestinaI: No nausea, vomiting, diarrhea, abdominal  pain, fecal incontinence Genitourinary:  No dysuria, urinary retention or frequency Musculoskeletal:  No neck pain, back pain Integumentary: No rash, pruritus, skin lesions Neurological: as above Psychiatric: No depression, insomnia, anxiety Endocrine: No palpitations, fatigue, diaphoresis, mood swings, change in appetite, change in weight, increased thirst Hematologic/Lymphatic:  No anemia, purpura, petechiae. Allergic/Immunologic: no itchy/runny eyes, nasal congestion, recent allergic reactions, rashes  PHYSICAL EXAM: Filed Vitals:   10/25/14 0809  BP: 124/68  Pulse: 72  Temp: 98.2 F (36.8 C)  Resp: 16   General: No acute distress Head:  Normocephalic/atraumatic Eyes:  fundi unremarkable, without vessel changes, exudates, hemorrhages or papilledema. Neck: supple, no paraspinal tenderness, full range of motion Back: No paraspinal tenderness Heart: regular rate and rhythm Lungs: Clear to auscultation bilaterally. Vascular: No carotid bruits. Neurological Exam: Mental status: alert and oriented to person, place, and time, recent and remote memory intact, fund of knowledge intact, attention and concentration intact, speech fluent and not dysarthric, language intact. Cranial nerves: CN I: not tested CN II: pupils equal, round and reactive to light, visual fields intact, fundi unremarkable, without vessel changes, exudates, hemorrhages or papilledema. CN III, IV, VI:  full range of motion, no nystagmus, no ptosis CN V: reduced right V1 distribution sensation CN VII: upper and lower face symmetric CN VIII: hearing intact CN IX, X: gag intact, uvula midline CN XI: sternocleidomastoid and trapezius muscles intact CN XII: tongue midline Bulk & Tone: normal, no fasciculations. Motor:  4+ right deltoid, 5- right biceps, 5- right grip, 2-3 right ankle dorsiflexion, give-way weakness 5- both quads, give-way weakness 5-/5 left hamstrings.  Otherwise, 5/5. Sensation:  Reduced pinprick and  vibration sensation in right upper and lower extremities Deep Tendon Reflexes:  2+ throughout, toes downgoing Finger to nose testing:  No dysmetria Heel to shin:  No dysmetria Gait:  Ambulates with right limp.  Able to turn around.  Unable to walk on toes or heels.  Cannot walk in tandem. Romberg negative.  IMPRESSION: Probable relapsing-remitting multiple sclerosis.  Depression  PLAN: 1.  Will set her up again for Tecfedera with the assistance program. 2.  Will set her up for Solu-Medrol 1060m IV daily for 3 days 3.  Will get MRI of brain and cervical spine with and without contrast. 4.  Continue vitamin D 5.  Recheck CBC w/diff and LFTs in 3 months after restarting Tecfedera.  Follow up soon after. 6.  Will get notes from prior neurologist, Dr. DMerlene Laughter7.  Citalopram 1104mdaily for depression  Thank you for allowing me to take part in the care of this patient.  AdMetta ClinesDO  CC:  ElRachell CiproMD  Ko[employer]illips OdorMD

## 2014-10-25 NOTE — Addendum Note (Signed)
Addended by: Glean Salen E on: 10/25/2014 09:45 AM   Modules accepted: Orders

## 2014-10-25 NOTE — Patient Instructions (Addendum)
1.  We will get you set up again for Tecfedera 2.  We will set you up for IV Solu-Medrol 1000mg  daily for 3 days. 10/31/14 12:45pm [facility]  Short Stay  3.  I prescribed you citalopram 10mg  daily for depression. 4.  Recheck CBC with differential and liver function tests in 3 months after restarting Tecfedera with follow up soon after. 5.  Will get MRI of brain and cervical spine with and without contrast. 11/10/14 [facility] 12pm   1. Vamos a llegar a configurar de nuevo por Tecfedera 2. Vamos a establecer que para 1000 mg IV Solu-Medrol al da durante 3 das. 28/03/16 24:45 [facility] [place] 3. Le indiqu que Citalopram 10 mg al da para la depresin. 4. Volver a revisar CBC con diferencial y pruebas de funcin heptica en los 3 meses despus de reiniciar Tecfedera con un seguimiento poco despus. 5. obtendr resonancia magntica del cerebro y la columna cervical con y sin contraste. 24:00 02/06/15 hospital [facility]

## 2014-10-25 NOTE — Telephone Encounter (Signed)
I spoke with Irma at Dr [facility] office to make them aware that  Mri of brainand cervical spine with and without have been set up at [facility] on 11/10/14 they agree with this

## 2014-10-25 NOTE — Telephone Encounter (Signed)
Per Irma from Dr [facility] office [phone] MRI is sch and approved for April 3 at 10:30 at [facility] imaging

## 2014-10-31 ENCOUNTER — Other Ambulatory Visit: Payer: Self-pay | Admitting: *Deleted

## 2014-10-31 ENCOUNTER — Ambulatory Visit (HOSPITAL_COMMUNITY)
Admission: RE | Admit: 2014-10-31 | Discharge: 2014-10-31 | Disposition: A | Discharge status: Home / Self Care | Payer: 59 | Source: Ambulatory Visit | Attending: Neurology | Admitting: Neurology

## 2014-10-31 DIAGNOSIS — G35 Multiple sclerosis: Secondary | ICD-10-CM | POA: Insufficient documentation

## 2014-10-31 DIAGNOSIS — Z79899 Other long term (current) drug therapy: Secondary | ICD-10-CM | POA: Insufficient documentation

## 2014-10-31 DIAGNOSIS — Z5181 Encounter for therapeutic drug level monitoring: Secondary | ICD-10-CM | POA: Diagnosis not present

## 2014-10-31 MED ORDER — SODIUM CHLORIDE 0.9 % IV SOLN
1000.0000 mg | Freq: Every day | INTRAVENOUS | Status: DC
  Administered 2014-10-31: 1000 mg via INTRAVENOUS
  Filled 2014-10-31: qty 8

## 2014-11-01 ENCOUNTER — Encounter (HOSPITAL_COMMUNITY)
Admission: RE | Admit: 2014-11-01 | Discharge: 2014-11-01 | Disposition: A | Discharge status: Home / Self Care | Payer: 59 | Source: Ambulatory Visit | Attending: Neurology | Admitting: Neurology

## 2014-11-01 DIAGNOSIS — G35 Multiple sclerosis: Secondary | ICD-10-CM | POA: Insufficient documentation

## 2014-11-01 MED ORDER — METHYLPREDNISOLONE SODIUM SUCC 1000 MG IJ SOLR
1000.0000 mg | Freq: Every day | INTRAMUSCULAR | Status: DC
  Administered 2014-11-01: 1000 mg via INTRAVENOUS
  Filled 2014-11-01: qty 8

## 2014-11-02 ENCOUNTER — Encounter (HOSPITAL_COMMUNITY)
Admission: RE | Admit: 2014-11-02 | Discharge: 2014-11-02 | Disposition: A | Discharge status: Home / Self Care | Payer: 59 | Source: Ambulatory Visit | Attending: Neurology | Admitting: Neurology

## 2014-11-02 DIAGNOSIS — G35 Multiple sclerosis: Secondary | ICD-10-CM | POA: Diagnosis not present

## 2014-11-02 MED ORDER — SODIUM CHLORIDE 0.9 % IV SOLN
1000.0000 mg | Freq: Every day | INTRAVENOUS | Status: DC
  Administered 2014-11-02: 1000 mg via INTRAVENOUS
  Filled 2014-11-02: qty 8

## 2014-11-06 ENCOUNTER — Other Ambulatory Visit: Payer: Self-pay

## 2014-11-08 ENCOUNTER — Telehealth: Payer: Self-pay | Admitting: Neurology

## 2014-11-08 NOTE — Telephone Encounter (Signed)
I called and left message for patient daughter to call back regarding MS medication

## 2014-11-08 NOTE — Telephone Encounter (Signed)
Pt daughter Sherald Hess and was checking on the auth for the medication please call her at [phone] she states that we can leave a message on her phone. She is trying to fill the medication but they will not fill it without the [place]

## 2014-11-09 ENCOUNTER — Telehealth: Payer: Self-pay | Admitting: Neurology

## 2014-11-09 NOTE — Telephone Encounter (Signed)
Pt daughter [name] called you with the telephone number for the medication Auth it is [phone]  Pt daughter phone number is [phone]

## 2014-11-09 NOTE — Telephone Encounter (Signed)
Left patient's daughter a message to let her know that Prior auth for medication has been faxed to insurance and pharmacy

## 2014-11-10 ENCOUNTER — Ambulatory Visit (HOSPITAL_COMMUNITY): Payer: 59

## 2014-11-10 ENCOUNTER — Ambulatory Visit (HOSPITAL_COMMUNITY): Admission: RE | Admit: 2014-11-10 | Payer: 59 | Source: Ambulatory Visit

## 2014-11-16 ENCOUNTER — Ambulatory Visit (HOSPITAL_COMMUNITY): Payer: 59

## 2014-11-16 ENCOUNTER — Ambulatory Visit (HOSPITAL_COMMUNITY): Admission: RE | Admit: 2014-11-16 | Payer: 59 | Source: Ambulatory Visit

## 2014-12-06 ENCOUNTER — Ambulatory Visit (HOSPITAL_COMMUNITY): Payer: 59

## 2014-12-06 ENCOUNTER — Ambulatory Visit (HOSPITAL_COMMUNITY): Admission: RE | Admit: 2014-12-06 | Payer: 59 | Source: Ambulatory Visit

## 2014-12-14 ENCOUNTER — Other Ambulatory Visit: Payer: Self-pay | Admitting: *Deleted

## 2014-12-14 DIAGNOSIS — Q232 Congenital mitral stenosis: Secondary | ICD-10-CM

## 2014-12-21 ENCOUNTER — Ambulatory Visit (HOSPITAL_COMMUNITY)
Admission: RE | Admit: 2014-12-21 | Discharge: 2014-12-21 | Disposition: A | Discharge status: Home / Self Care | Payer: 59 | Source: Ambulatory Visit | Attending: Neurology | Admitting: Neurology

## 2014-12-21 DIAGNOSIS — G35 Multiple sclerosis: Secondary | ICD-10-CM | POA: Diagnosis present

## 2014-12-21 DIAGNOSIS — M5031 Other cervical disc degeneration,  high cervical region: Secondary | ICD-10-CM | POA: Diagnosis not present

## 2014-12-21 MED ORDER — GADOBENATE DIMEGLUMINE 529 MG/ML IV SOLN
10.0000 mL | Freq: Once | INTRAVENOUS | Status: AC | PRN
  Administered 2014-12-21: 10 mL via INTRAVENOUS

## 2015-03-23 ENCOUNTER — Ambulatory Visit: Payer: 59 | Admitting: Neurology

## 2015-05-15 ENCOUNTER — Encounter: Payer: Self-pay | Admitting: Women's Health

## 2015-05-15 ENCOUNTER — Ambulatory Visit (INDEPENDENT_AMBULATORY_CARE_PROVIDER_SITE_OTHER): Payer: 59 | Admitting: Women's Health

## 2015-05-15 VITALS — BP 126/78 | HR 80 | Wt 164.5 lb

## 2015-05-15 DIAGNOSIS — Z30017 Encounter for initial prescription of implantable subdermal contraceptive: Secondary | ICD-10-CM | POA: Insufficient documentation

## 2015-05-15 DIAGNOSIS — Z3049 Encounter for surveillance of other contraceptives: Secondary | ICD-10-CM

## 2015-05-15 DIAGNOSIS — Z3202 Encounter for pregnancy test, result negative: Secondary | ICD-10-CM

## 2015-05-15 DIAGNOSIS — Z3046 Encounter for surveillance of implantable subdermal contraceptive: Secondary | ICD-10-CM

## 2015-05-15 LAB — POCT URINE PREGNANCY: Preg Test, Ur: NEGATIVE

## 2015-05-15 NOTE — Progress Notes (Signed)
Patient ID: [name], female   DOB: 1970-11-24, 44 y.o.   MRN: [id number] [name] is a 44 y.o. year old Hispanic female here for Nexplanon removal and reinsertion.  She was given informed consent for removal and reinsertion of her Nexplanon. Her Nexplanon was placed 03/2012, Patient's last menstrual period was 05/01/2015., and her pregnancy test today was neg.   Risks/benefits/side effects of Nexplanon have been discussed and her questions have been answered.  Specifically, a failure rate of 08/998 has been reported, with an increased failure rate if pt takes St. John's Wort and/or antiseizure medicaitons.  [name] is aware of the common side effect of irregular bleeding, which the incidence of decreases over time.  BP 126/78 mmHg  Pulse 80  Wt 164 lb 8 oz (74.617 kg)  LMP 05/01/2015 Patient's last menstrual period was 05/01/2015. Results for orders placed or performed in visit on 05/15/15 (from the past 24 hour(s))  POCT urine pregnancy   Collection Time: 05/15/15 10:52 AM  Result Value Ref Range   Preg Test, Ur Negative Negative     Appropriate time out taken. Nexplanon site identified Rt arm.  Pt is Rt handed, states she got confused when it was put in last time and told them she was left handed. Offered to place in Lt arm this time, pt would like to keep it in Rt. Area prepped in usual sterile fashon. Two cc's of 2% lidocaine was used to anesthetize the area. A small stab incision was made right beside the implant on the distal portion.  The Nexplanon rod was grasped using hemostats and removed intact without difficulty.  The area was cleansed again with betadine and the Nexplanon was inserted per manufacturer's recommendations without difficulty.  Steri-strips and a pressure bandage was applied.  There was less than 3 cc blood loss. There were no complications.  The patient tolerated the procedure well.  She was instructed to keep the area clean and dry, remove pressure bandage  in 24 hours, and keep insertion site covered with the steri-strips for 3-5 days.  She was given a card indicating date Nexplanon was inserted and date it needs to be removed.   Follow-up PRN problems.  Marge Duncans CNM, [facility] 05/15/2015 11:32 AM

## 2015-05-15 NOTE — Patient Instructions (Signed)
Keep the area clean and dry.  You can remove the big bandage in 24 hours, and the small steri-strip bandage in 3-5 days.  A back up method, such as condoms, should be used for two weeks. You may have irregular vaginal bleeding for the first 6 months after the Nexplanon is placed, then the bleeding usually lightens and it is possible that you may not have any periods.  If you have any concerns, please give [place] a call.    Etonogestrel implant Qu es este medicamento? El ETONOGESTREL es un dispositivo anticonceptivo (control de la natalidad). Se utiliza para Neurosurgeon. Se puede utilizar hasta 3 aos. Este medicamento puede ser utilizado para otros usos; si tiene alguna pregunta consulte con su proveedor de atencin mdica o con su farmacutico. Qu le debo informar a mi profesional de la salud antes de tomar este medicamento? Necesita saber si usted presenta alguno de los siguientes problemas o situaciones: sangrado vaginal anormal enfermedad vascular o cogulos sanguneos cncer de mama, cervical, heptico depresin diabetes enfermedad de la vescula biliar dolores de cabeza enfermedad cardiaca o ataque cardiaco reciente alta presin sangunea alto nivel de colesterol enfermedad renal enfermedad heptica convulsiones fuma tabaco una reaccin alrgica o inusual al etonogestrel, otras hormonas, anestsicos o antispticos, medicamentos, alimentos, colorantes o conservantes si est embarazada o buscando quedar embarazada si est amamantando a un beb Cmo debo [employer]? Este dispositivo se inserta debajo de la piel en la cara interna de la parte superior del brazo por un profesional de Radiographer, therapeutic. Hable con su pediatra para informarse acerca del uso de este medicamento en nios. Puede requerir atencin especial. Sobredosis: Pngase en contacto inmediatamente con un centro toxicolgico o una sala de urgencia si usted cree que haya tomado demasiado medicamento. ATENCIN: [employer] es solo para usted. No comparta este medicamento con nadie. Qu sucede si me olvido de una dosis? No se aplica en este caso. Qu puede interactuar con este medicamento? No tome esta medicina con ninguno de los siguientes medicamentos: amprenavir bosentano fosamprenavir Esta medicina tambin puede interactuar con los siguientes medicamentos: medicamentos barbitricos para inducir el sueo o tratar convulsiones ciertos medicamentos para las infecciones micticas tales como quetoconazol e itraconazol griseofulvina medicamentos para tratar convulsiones, tales como carbamazepina, felbamato, Agricultural engineer, fenitona, topiramato modafinil fenilbutazona rifampicina algunos medicamentos para tratar la infeccin por VIH tales como atazanavir, indinavir, lopinavir, nelfinavir, tipranavir, ritonavir hierba de [address] ser que esta lista no menciona todas las posibles interacciones. Informe a su profesional de [employer] de [employer] productos a base de hierbas, medicamentos de [place] o suplementos nutritivos que est tomando. Si usted fuma, consume bebidas alcohlicas o si utiliza drogas ilegales, indqueselo tambin a su profesional de [employer]. Algunas sustancias pueden interactuar con su medicamento. A qu debo estar atento al usar [employer]? Este producto no protege contra la infeccin por el VIH (SIDA) u otras enfermedades de transmisin sexual. Usted debe sentir el implante al presionar con las yemas de los dedos sobre la piel donde se insert. Contacte a su mdico si no se siente el implante y [place] un mtodo anticonceptivo no hormonal (como el condn) hasta que el mdico confirma que el implante est en su Environmental consultant. Si siente que el implante puede haber roto o doblado en su brazo, pngase en contacto con su proveedor de atencin mdica. Qu efectos secundarios puedo tener al [employer] este medicamento? Efectos secundarios que debe informar a su mdico o a Producer, television/film/video de Radiographer, therapeutic  tan pronto como sea posible: reacciones alrgicas como erupcin cutnea, picazn o urticarias, hinchazn de la cara, labios o lengua ndulos mamarios cambios de emociones o humor humor deprimido sangrado menstrual prolongado o abundante dolor, irritacin, hichazn o Therapist, music de la insercin Immunologist de la insercin signos de Human resources officer de la insercin, tales como fiebre y enrojecimiento, Engineer, mining o descarga de la piel signos de Psychiatrist signos y sntomas de un cogulo sanguneo tales como problemas respiratorios; cambios en la visin; dolor en el pecho; dolor de cabeza severo, repentino; dolor, hinchazn, clida en la pierna; dificultad para hablar; entumecimiento o debilidad repentina de la cara, brazo o pierna signos y sntomas de lesin al hgado como orina amarillo oscuro o Child psychotherapist; sensacin general de estar enfermo o sntomas gripales; heces claras; prdida de apetito; nuseas; dolor en la regin abdominal superior derecha; cansancio o debilidad inusual; color amarillento de los ojos o la piel sangrado, flujo vaginal inusual signos y sntomas de un derrame cerebral tales como cambios en la visin; confusin; dificultad para hablar o entender; dolores de cabeza severos; entumecimiento o debilidad repentina de la cara, brazo o pierna; dificultad para andar; Research scientist (life sciences); prdida del equilibrio o coordinacin Efectos secundarios que, por lo general, no requieren Psychologist, prison and probation services (debe informarlos a su mdico o a Producer, television/film/video de la salud si persisten o si son molestos): acn dolor de espalda dolor de pecho cambios de peso mareos sensacin general de estar enfermo o sntomas gripales dolor de cabeza sangrado menstrual irregular nuseas dolor de garganta irritacin o inflamacin vaginal Puede ser que esta lista no menciona todos los posibles efectos secundarios. Comunquese a su mdico por asesoramiento mdico [employer]. Usted puede informar los efectos  secundarios a la FDA por telfono al 1-800-FDA-1088. Dnde debo guardar mi medicina? Este medicamento se administra en hospitales o clnicas y no necesitar guardarlo en su domicilio. ATENCIN: Este folleto es un resumen. Puede ser que no cubra toda la posible informacin. Si usted tiene preguntas acerca de esta medicina, consulte con su mdico, su farmacutico o su profesional de Radiographer, therapeutic.    2016, Elsevier/Gold Standard. (2014-09-14 00:00:00)

## 2015-06-06 ENCOUNTER — Telehealth: Payer: Self-pay

## 2015-06-06 DIAGNOSIS — G35 Multiple sclerosis: Secondary | ICD-10-CM

## 2015-06-06 NOTE — Telephone Encounter (Signed)
Patient's daughter presented to clinic asking for order's for lab work. Pt has not been in contact w/ our office since March, cancelled Augusts appointment. Spoke with Dr. Everlena Cooper who requested a CBC w/ differential. However, also wanted patient to schedule f/u and keep it due to complicated history and medications she is on. Asked daughter to schedule appointment, said she would call back as soon as she got home and her mother was with her.

## 2015-06-20 ENCOUNTER — Encounter: Payer: Self-pay | Admitting: Neurology

## 2015-06-20 ENCOUNTER — Other Ambulatory Visit (INDEPENDENT_AMBULATORY_CARE_PROVIDER_SITE_OTHER): Payer: 59

## 2015-06-20 ENCOUNTER — Ambulatory Visit (INDEPENDENT_AMBULATORY_CARE_PROVIDER_SITE_OTHER): Payer: 59 | Admitting: Neurology

## 2015-06-20 VITALS — BP 134/78 | HR 83 | Wt 163.7 lb

## 2015-06-20 DIAGNOSIS — G35 Multiple sclerosis: Secondary | ICD-10-CM

## 2015-06-20 DIAGNOSIS — R399 Unspecified symptoms and signs involving the genitourinary system: Secondary | ICD-10-CM

## 2015-06-20 DIAGNOSIS — M79604 Pain in right leg: Secondary | ICD-10-CM

## 2015-06-20 LAB — CBC WITH DIFFERENTIAL/PLATELET
Basophils Absolute: 0.1 10*3/uL (ref 0.0–0.1)
Basophils Relative: 0.7 % (ref 0.0–3.0)
Eosinophils Absolute: 0.1 10*3/uL (ref 0.0–0.7)
Eosinophils Relative: 1.8 % (ref 0.0–5.0)
HCT: 42.7 % (ref 36.0–46.0)
Hemoglobin: 14.1 g/dL (ref 12.0–15.0)
Lymphocytes Relative: 31 % (ref 12.0–46.0)
Lymphs Abs: 2.1 10*3/uL (ref 0.7–4.0)
MCHC: 33 g/dL (ref 30.0–36.0)
MCV: 89.7 fl (ref 78.0–100.0)
Monocytes Absolute: 0.4 10*3/uL (ref 0.1–1.0)
Monocytes Relative: 5.7 % (ref 3.0–12.0)
Neutro Abs: 4.2 10*3/uL (ref 1.4–7.7)
Neutrophils Relative %: 60.8 % (ref 43.0–77.0)
Platelets: 339 10*3/uL (ref 150.0–400.0)
RBC: 4.76 Mil/uL (ref 3.87–5.11)
RDW: 13.7 % (ref 11.5–15.5)
WBC: 6.9 10*3/uL (ref 4.0–10.5)

## 2015-06-20 LAB — HEPATIC FUNCTION PANEL
ALT: 9 U/L (ref 0–35)
AST: 12 U/L (ref 0–37)
Albumin: 4.4 g/dL (ref 3.5–5.2)
Alkaline Phosphatase: 72 U/L (ref 39–117)
Bilirubin, Direct: 0.1 mg/dL (ref 0.0–0.3)
Total Bilirubin: 0.6 mg/dL (ref 0.2–1.2)
Total Protein: 7.8 g/dL (ref 6.0–8.3)

## 2015-06-20 LAB — URINALYSIS, ROUTINE W REFLEX MICROSCOPIC
Bilirubin Urine: NEGATIVE
Ketones, ur: NEGATIVE
Leukocytes, UA: NEGATIVE
Nitrite: NEGATIVE
Specific Gravity, Urine: 1.015 (ref 1.000–1.030)
Total Protein, Urine: NEGATIVE
Urine Glucose: NEGATIVE
Urobilinogen, UA: 0.2 (ref 0.0–1.0)
pH: 6 (ref 5.0–8.0)

## 2015-06-20 MED ORDER — GABAPENTIN 300 MG PO CAPS: 300.0000 mg | ORAL_CAPSULE | Freq: Three times a day (TID) | ORAL | Status: DC

## 2015-06-20 NOTE — Progress Notes (Signed)
Chart forwarded.  

## 2015-06-20 NOTE — Progress Notes (Signed)
NEUROLOGY FOLLOW UP OFFICE NOTE  [name] [id number]  HISTORY OF PRESENT ILLNESS: [name] is a 44 year old right-handed Spanish-speaking woman who follows up for multiple sclerosis.  She is accompanied by her daughter who provides some history.  Interpretor is present.  UPDATE: MRI of brain and cervical spine with and without contrast performed on 12/21/14 showed stable chronic cerebral white matter lesions when compared to iamges from 03/18/11, and no spinal cord involvement.  She was restarted on Tecfidera in March and had IV steroids at that time.  She did not follow up or have labs rechecked, as I had requested.  Over the past 2 weeks, she has had recurrence of gradual right sided weakness, particularly of the right leg.  She reports bilateral low back pain with aching pain down the right leg to the foot, although it is vague.  HISTORY: She was diagnosed with multiple sclerosis in 2009.  At that time, she developed right sided numbness of the face, arm and leg.  There was also associated weakness involving the arm and leg as well.  There was associated bilateral blurred vision.  There was no associated headache.  It lasted 3 or 4 days.  At first, it was believed she had a stroke.  However, MRI of the brain with and without contrast showed numerous non-specific periventricular and subcortical hyperintensities, greater than expected for age.  There was no abnormal enhancement.  She underwent lumbar puncture in February 2009.  CSF cell count was 4, protein 21, glucose 88, IgG index within normal limits and no bands.  Serum ANA and Sed Rate were negative.  She was treated in the hospital with IV Solu-Medrol  She was followed by a neurologist, Dr. Merlene Laughter.  Initially she was taking Rebif, which worked well.  However, it caused injection site reaction.  Around 2014, she was switched to [place].  She was taking Tecfedera until about late-2015.  She changed insurance and couldn't afford  it.  Between 2009 and March 2016, she had 3 similar episodes, requiring IV Solu-Medrol.  The episodes presented with right sided numbness and tingling of face, arm and leg.  There is some weakness involving the arm and leg.  It was associated with slight posterior headache.    Most recent MRI of the brain with and without contrast available for review is from 03/19/11 and personally reviewed.  It shows multiple supratentorial white matter hyperintensities without evidence of acute demyelination.  It mentions that it appears stable from prior imaging from 2009.  Recent labs from 10/21/14 show WBC 7.8, absolute lymp 1.8,  Hgb 14.1, HCT 44, PLT 412, 70% Neut, total protein 7.5, albumin 4.6, Alk Phos 77, AST 13, ALT 11, vitamin D 25-hydroxy 11.6.  She was started on vitzamin D 50,000 units weekly.  She has no personal history of migraine.  She has no family history of MS.  She notes depression.  She was previously on citalopram, which was helpful.  PAST MEDICAL HISTORY: Past Medical History  Diagnosis Date  . Multiple sclerosis ([place]) 06/19/2011  . Multiple sclerosis ([place]) 06/19/2011  . Headache     MEDICATIONS: Current Outpatient Prescriptions on File Prior to Visit  Medication Sig Dispense Refill  . UNKNOWN TO PATIENT     . Vitamin D, Ergocalciferol, (DRISDOL) 50000 UNITS CAPS capsule   1   No current facility-administered medications on file prior to visit.    ALLERGIES: No Known Allergies  FAMILY HISTORY: Family History  Problem Relation Age of  Onset  . Hypertension Mother     SOCIAL HISTORY: Social History   Social History  . Marital Status: Married    Spouse Name: N/A  . Number of Children: N/A  . Years of Education: N/A   Occupational History  . Not on file.   Social History Main Topics  . Smoking status: Never Smoker   . Smokeless tobacco: Never Used  . Alcohol Use: No  . Drug Use: No  . Sexual Activity:    Partners: Male    Birth Control/ Protection: Implant    Other Topics Concern  . Not on file   Social History Narrative    REVIEW OF SYSTEMS: Constitutional: No fevers, chills, or sweats, no generalized fatigue, change in appetite Eyes: No visual changes, double vision, eye pain Ear, nose and throat: No hearing loss, ear pain, nasal congestion, sore throat Cardiovascular: No chest pain, palpitations Respiratory:  No shortness of breath at rest or with exertion, wheezes GastrointestinaI: No nausea, vomiting, diarrhea, abdominal pain, fecal incontinence Genitourinary:  No dysuria, urinary retention or frequency Musculoskeletal:  back pain Integumentary: No rash, pruritus, skin lesions Neurological: as above Psychiatric: No depression, insomnia, anxiety Endocrine: No palpitations, fatigue, diaphoresis, mood swings, change in appetite, change in weight, increased thirst Hematologic/Lymphatic:  No anemia, purpura, petechiae. Allergic/Immunologic: no itchy/runny eyes, nasal congestion, recent allergic reactions, rashes  PHYSICAL EXAM: Filed Vitals:   06/20/15 0934  BP: 134/78  Pulse: 83   General: No acute distress.  Patient appears well-groomed.   Head:  Normocephalic/atraumatic Eyes:  Fundoscopic exam unremarkable without vessel changes, exudates, hemorrhages or papilledema. Neck: supple, no paraspinal tenderness, full range of motion Heart:  Regular rate and rhythm Lungs:  Clear to auscultation bilaterally Back: No paraspinal tenderness Neurological Exam: alert and oriented to person, place, and time. Attention span and concentration intact, recent and remote memory intact, fund of knowledge intact.  Speech fluent and not dysarthric, language intact.  CN II-XII intact. Fundoscopic exam unremarkable without vessel changes, exudates, hemorrhages or papilledema.  Bulk and tone normal, 5- right deltoid, 4+ right hip flexion, 5- right quad, hamstring and ankle dorsiflexion.  Otherwise, 5/5.Marland Kitchen  Sensation to pinprick reduced in right arm and  leg.  Vibration sensation intacft.  Deep tendon reflexes 2+ throughout, toes downgoing.  Finger to nose mildly slower on right.  Gait with right limp,Timed 25 Foot Walk 17.62 seconds.  Romberg negative.  IMPRESSION: Relapsing-remitting MS.  She reports increased symptoms consistent with typical flare-ups over the past 2 weeks.  MRI performed shortly after last flare-up revealed no new lesions and stable compared to prior imaging from 2012.  Consider that leg pain may be due to lumbar radiculopathy as well.  PLAN: 1.  Continue the Tecfidera and D3 2.  Will check CBC with diff, LFTs and UA 3.  Will check MRI of brain with and without contrast to look for acute active lesions and MRI of lumbar spine without contrast to look for evidence of possible radiculopathy 4.  Take gabapentin 321m three times daily for leg pain. 5.  Repeat CBC with diff in 3 months prior to next follow up 6.  If weakness gets worse, call sooner.  AMetta Clines DO  CC: ERachell Cipro MD

## 2015-06-20 NOTE — Addendum Note (Signed)
Addended by: Vivien Rota on: 16/05/9603 10:41 AM   Modules accepted: Orders

## 2015-06-20 NOTE — Patient Instructions (Signed)
1.  Continue the Tecfidera 2.  Will check CBC with diff, LFTs and UA 3.  Will check MRI of brain with and without contrast and MRI of lumbar spine without contrast 4.  Take gabapentin 300mg  three times daily for leg pain. 5.  Repeat CBC with diff in 3 months prior to next follow up 6.  If weakness gets worse, call sooner.

## 2015-07-01 ENCOUNTER — Ambulatory Visit
Admission: RE | Admit: 2015-07-01 | Discharge: 2015-07-01 | Disposition: A | Discharge status: Home / Self Care | Payer: 59 | Source: Ambulatory Visit | Attending: Neurology | Admitting: Neurology

## 2015-07-01 DIAGNOSIS — G35 Multiple sclerosis: Secondary | ICD-10-CM

## 2015-07-01 DIAGNOSIS — M79604 Pain in right leg: Secondary | ICD-10-CM

## 2015-07-01 MED ORDER — GADOBENATE DIMEGLUMINE 529 MG/ML IV SOLN: 15.0000 mL | Freq: Once | INTRAVENOUS | Status: DC | PRN

## 2015-07-03 ENCOUNTER — Telehealth: Payer: Self-pay | Admitting: Neurology

## 2015-07-03 NOTE — Telephone Encounter (Signed)
Message relayed to patient. Verbalized understanding and denied questions.   

## 2015-07-03 NOTE — Telephone Encounter (Signed)
Pt/daughter called for recent MRI results/ call back @ [phone]

## 2015-07-03 NOTE — Telephone Encounter (Signed)
Message relayed to daughter.   "MRI of the brain shows no new or active MS lesions to suggest active MS flare-up. MRI of lumbar spine shows lumbar disc disease but no pinched nerves to explain leg pain. At this point, I do not have explanation for increased right sided pain and weakness. I would continue the gabapentin for pain. She is Spanish-speaking, so you may need interpretor, unless you can speak with her daughter if she is listed as a contact that has privilege to discuss her mother's medical condition."  Daughter states that at OV, it was mentioned that if MRI came back clean it was possible that there may be a blood clot in her leg. Daughter wants to know if this is still a possibility and what she should do to find out? Please advise.

## 2015-07-03 NOTE — Telephone Encounter (Signed)
I don't think it is a blood clot in the leg since this is a recurrent pain that she has had on and off for several years.

## 2015-09-04 ENCOUNTER — Telehealth: Payer: Self-pay

## 2015-09-04 NOTE — Telephone Encounter (Signed)
Left VM on [name] daughter [phone] number.

## 2015-09-04 NOTE — Telephone Encounter (Signed)
-----   Message from Richarda Overlie, [place] sent at 06/20/2015 10:05 AM EST ----- Regarding: Needs CBC Needs cbc lab before appointment! Order is placed

## 2015-09-28 ENCOUNTER — Ambulatory Visit (INDEPENDENT_AMBULATORY_CARE_PROVIDER_SITE_OTHER): Payer: BLUE CROSS/BLUE SHIELD | Admitting: Advanced Practice Midwife

## 2015-09-28 ENCOUNTER — Encounter: Payer: Self-pay | Admitting: Advanced Practice Midwife

## 2015-09-28 VITALS — BP 112/80 | HR 88 | Wt 168.0 lb

## 2015-09-28 DIAGNOSIS — R300 Dysuria: Secondary | ICD-10-CM | POA: Diagnosis not present

## 2015-09-28 DIAGNOSIS — R319 Hematuria, unspecified: Secondary | ICD-10-CM

## 2015-09-28 DIAGNOSIS — R350 Frequency of micturition: Secondary | ICD-10-CM | POA: Diagnosis not present

## 2015-09-28 LAB — POCT URINALYSIS DIPSTICK
Blood, UA: 2
Glucose, UA: NEGATIVE
Ketones, UA: NEGATIVE
Leukocytes, UA: NEGATIVE
Nitrite, UA: NEGATIVE

## 2015-09-28 MED ORDER — PHENAZOPYRIDINE HCL 200 MG PO TABS: 200.0000 mg | ORAL_TABLET | Freq: Three times a day (TID) | ORAL | Status: DC | PRN

## 2015-09-28 NOTE — Progress Notes (Signed)
   [facility] Visit  Patient name: [name] MRN [id number]  Date of birth: 08/25/1970  CC & HPI:  [name] is a 45 y.o. Hispanic female presenting today for c/o seeing blood when she wipes for 3 weeks, thinks its in her urine. Has a lot of dysuria and frequency, hurts "they whole time" she voids and avoids going to the bathroom.  Went to urgent care yesterday, told she did not have a UTI, but they are running a culture.  No flank pain.    Pertinent History Reviewed:  Medical & Surgical Hx:   Past Medical History  Diagnosis Date  . Multiple sclerosis ([facility]) 06/19/2011  . Multiple sclerosis ([facility]) 06/19/2011  . Headache    History reviewed. No pertinent past surgical history. Family History  Problem Relation Age of Onset  . Hypertension Mother     Current outpatient prescriptions:  .  Dimethyl Fumarate (TECFIDERA) 240 MG CPDR, Take by mouth., Disp: , Rfl:  .  etonogestrel (NEXPLANON) 68 MG IMPL implant, 1 each by Subdermal route once., Disp: , Rfl:  .  UNKNOWN TO PATIENT, OTC FOAM, Disp: , Rfl:  .  gabapentin (NEURONTIN) 300 MG capsule, Take 1 capsule (300 mg total) by mouth 3 (three) times daily. (Patient not taking: Reported on 09/28/2015), Disp: 90 capsule, Rfl: 3 Social History: Reviewed -  reports that she has never smoked. She has never used smokeless tobacco.  Review of Systems:   Constitutional: Negative for fever and chills Eyes: Negative for visual disturbances Respiratory: Negative for shortness of breath, dyspnea Cardiovascular: Negative for chest pain or palpitations  Gastrointestinal: Negative for vomiting, diarrhea and constipation Genitourinary: Negative for vaginal irritation or itching Musculoskeletal: Negative for back pain, joint pain, myalgias  Neurological: Negative for dizziness and headaches    Objective Findings:    Physical Examination: General appearance - well appearing, and in no distress Mental status -  alert, oriented to person, place, and time Chest:  Normal respiratory effort Heart - normal rate and regular rhythm Abdomen:  Soft, suprapubic tnederness Pelvic: no blood in vagina. Normal dc Musculoskeletal:  Normal range of motion without pain Extremities:  No edema    Results for orders placed or performed in visit on 09/28/15 (from the past 24 hour(s))  POCT urinalysis dipstick   Collection Time: 09/28/15 11:08 AM  Result Value Ref Range   Color, UA yellow    Clarity, UA clear    Glucose, UA neg    Bilirubin, UA     Ketones, UA neg    Spec Grav, UA     Blood, UA 2    pH, UA     Protein, UA trace    Urobilinogen, UA     Nitrite, UA neg    Leukocytes, UA Negative Negative      Assessment & Plan:  A:   Probable UTI P:  rx pyridium for pain, await urine culture from urgent care (she has f/u instructions).  Call me if culture is neg--may need urology referal  CRESENZO-DISHMAN,Jacoby Zanni CNM 09/28/2015 11:17 AM

## 2015-10-03 ENCOUNTER — Telehealth: Payer: Self-pay | Admitting: Women's Health

## 2015-10-03 DIAGNOSIS — R1084 Generalized abdominal pain: Secondary | ICD-10-CM

## 2015-10-03 NOTE — Telephone Encounter (Signed)
Pt's daughter in law called because HEr mother in law does not speak english. Pt's daughter in law states that Drenda Freeze had sent her to get cultures done. PT 's daughter in law states that they came back normal and she doe not have an UTI. Pt's daughter in law would like to know if she mother in law could get an ultrasound of her pelvic just to make sure nothing is wrong. Please contact pt's daughter in law

## 2015-10-04 ENCOUNTER — Telehealth: Payer: Self-pay | Admitting: Advanced Practice Midwife

## 2015-10-04 NOTE — Telephone Encounter (Signed)
Pt called stating that the medication fran has prescribed her was not covered by her insurance, the pharmasist told her to grab 2 of over the counter medication that is the same thing. Pt would like to know if that is ok. Please contact pt

## 2015-10-04 NOTE — Telephone Encounter (Signed)
Urine cx neg, pyridium didn't help pain very much  Requesting pelvic US before uroglogy consult.  [place] ordered .

## 2015-10-04 NOTE — Telephone Encounter (Signed)
Pt states that medication is not covered by her insurance so the pharmacist advised her to pick up AZO. Pt aware that it is ok to take this medication and if her symptoms do not get any better to call our office and we can get her worked in. Pt verbalized understanding. Phone call was translated to pt by Candie Chroman.

## 2015-10-10 ENCOUNTER — Other Ambulatory Visit: Payer: Self-pay | Admitting: Advanced Practice Midwife

## 2015-10-10 ENCOUNTER — Telehealth: Payer: Self-pay | Admitting: Advanced Practice Midwife

## 2015-10-10 DIAGNOSIS — R1084 Generalized abdominal pain: Secondary | ICD-10-CM

## 2015-10-10 NOTE — Telephone Encounter (Signed)
Pt informed would recommend her keep her appt for tomorrow due to the pain she was having. Pt was scheduled for U/S and to see Drenda Freeze tomorrow. Candie Chroman interpreted.

## 2015-10-10 NOTE — Telephone Encounter (Signed)
Pt called stating that she would like to know if she still needs to come to her appointment tomorrow, because pt states that she is feeling much better. Pt does not speak english. Please contact pt

## 2015-10-11 ENCOUNTER — Other Ambulatory Visit: Payer: BLUE CROSS/BLUE SHIELD

## 2015-10-11 ENCOUNTER — Ambulatory Visit: Payer: BLUE CROSS/BLUE SHIELD | Admitting: Advanced Practice Midwife

## 2015-10-11 ENCOUNTER — Other Ambulatory Visit (INDEPENDENT_AMBULATORY_CARE_PROVIDER_SITE_OTHER): Payer: Self-pay

## 2015-10-11 DIAGNOSIS — G35 Multiple sclerosis: Secondary | ICD-10-CM

## 2015-10-11 LAB — CBC WITH DIFFERENTIAL/PLATELET
Basophils Absolute: 0 10*3/uL (ref 0.0–0.1)
Basophils Relative: 0.6 % (ref 0.0–3.0)
Eosinophils Absolute: 0.1 10*3/uL (ref 0.0–0.7)
Eosinophils Relative: 1.8 % (ref 0.0–5.0)
HCT: 40.9 % (ref 36.0–46.0)
Hemoglobin: 13.6 g/dL (ref 12.0–15.0)
Lymphocytes Relative: 31.6 % (ref 12.0–46.0)
Lymphs Abs: 2.4 10*3/uL (ref 0.7–4.0)
MCHC: 33.3 g/dL (ref 30.0–36.0)
MCV: 88.7 fl (ref 78.0–100.0)
Monocytes Absolute: 0.4 10*3/uL (ref 0.1–1.0)
Monocytes Relative: 5.7 % (ref 3.0–12.0)
Neutro Abs: 4.5 10*3/uL (ref 1.4–7.7)
Neutrophils Relative %: 60.3 % (ref 43.0–77.0)
Platelets: 353 10*3/uL (ref 150.0–400.0)
RBC: 4.62 Mil/uL (ref 3.87–5.11)
RDW: 13.6 % (ref 11.5–15.5)
WBC: 7.5 10*3/uL (ref 4.0–10.5)

## 2015-10-19 ENCOUNTER — Ambulatory Visit (INDEPENDENT_AMBULATORY_CARE_PROVIDER_SITE_OTHER): Payer: BLUE CROSS/BLUE SHIELD | Admitting: Neurology

## 2015-10-19 ENCOUNTER — Encounter: Payer: Self-pay | Admitting: Neurology

## 2015-10-19 VITALS — BP 122/68 | HR 89 | Ht 60.0 in | Wt 170.0 lb

## 2015-10-19 DIAGNOSIS — G35 Multiple sclerosis: Secondary | ICD-10-CM | POA: Diagnosis not present

## 2015-10-19 DIAGNOSIS — H539 Unspecified visual disturbance: Secondary | ICD-10-CM | POA: Diagnosis not present

## 2015-10-19 DIAGNOSIS — G44209 Tension-type headache, unspecified, not intractable: Secondary | ICD-10-CM | POA: Diagnosis not present

## 2015-10-19 MED ORDER — PREDNISONE 10 MG PO TABS: ORAL_TABLET | ORAL | Status: DC

## 2015-10-19 NOTE — Patient Instructions (Addendum)
1.  Restart Tecfidera.  If there is every a problem with getting it, please contact us immediately. 2.  To help stop the headache, take prednisone  tablets.  Take 6tabs x1day, then 5tabs x1day, then 4tabs x1day, then 3tabs x1day, then 2tabs x1day, then 1tab x1day, then STOP.  If headaches not better, call us.   3.  Limit use of Aleve to no more than 2 days out of the week.  Do not take it at all while on prednisone 4.  Recheck CBC with diff in 6 months.  Follow up soon afterwards. 5.  We will refer to ophthalmology for vision problems.

## 2015-10-19 NOTE — Progress Notes (Signed)
NEUROLOGY FOLLOW UP OFFICE NOTE  [name] [id number]  HISTORY OF PRESENT ILLNESS: [name] is a 45 year old right-handed Spanish-speaking woman who follows up for multiple sclerosis.  She is accompanied by her daughter who provides some history.  Interpretor is present.  UPDATE: Due to recurrence of gradual right sided weakness in November, particularly of the right leg, MRI of brain with and without contrast was performed on 07/01/15, which was unchanged and showed no acute demyelinating disease.  UA was negative.  She reported bilateral low back pain with aching pain down the right leg to the foot.  MRI of lumbar spine showed severe L5-S1 disc degeneration and mild bilateral neural foraminal stenosis.  At this time, CBC showed WEB 6.9 and ALC 2.1.  LFTs were normal.  Labs from 10/11/15 show CBC with WBC 7.5 and ALC 2.4.  She is doing well.  She denies pain, however she has had a daily non-throbbing headache in band-like distribution for the past 22 days.  She takes Aleve for it about 4 days per week.  She denies nausea or photophobia.   She also reports possibly needing glasses.  She sees "white" in her vision, involving both eyes.   She denies eye pain. She has not taken her Tecfidera for a month because her insurance said she needed pre-authorization again.   HISTORY: She was diagnosed with multiple sclerosis in 2009.  At that time, she developed right sided numbness of the face, arm and leg.  There was also associated weakness involving the arm and leg as well.  There was associated bilateral blurred vision.  There was no associated headache.  It lasted 3 or 4 days.  At first, it was believed she had a stroke.  However, MRI of the brain with and without contrast showed numerous non-specific periventricular and subcortical hyperintensities, greater than expected for age.  There was no abnormal enhancement.  She underwent lumbar puncture in February 2009.  CSF cell count  was 4, protein 21, glucose 88, IgG index within normal limits and no bands.  Serum ANA and Sed Rate were negative.  She was treated in the hospital with IV Solu-Medrol  She was followed by a neurologist, Dr. Merlene Laughter.  Initially she was taking Rebif, which worked well.  However, it caused injection site reaction.  Around 2014, she was switched to Tecifedera.  She was taking Tecfedera until about late-2015.  She changed insurance and couldn't afford it.  Between 2009 and March 2016, she had 3 similar episodes, requiring IV Solu-Medrol.  The episodes presented with right sided numbness and tingling of face, arm and leg.  There is some weakness involving the arm and leg.  It was associated with slight posterior headache.    MRI of brain and cervical spine with and without contrast performed on 12/21/14 showed stable chronic cerebral white matter lesions when compared to images from 03/18/11, and no spinal cord involvement.  It shows multiple supratentorial white matter hyperintensities without evidence of acute demyelination.  It mentions that it appears stable from prior imaging from 2009.  Recent labs from 10/21/14 show WBC 7.8, absolute lymp 1.8,  Hgb 14.1, HCT 44, PLT 412, 70% Neut, total protein 7.5, albumin 4.6, Alk Phos 77, AST 13, ALT 11, vitamin D 25-hydroxy 11.6.  She has no personal history of migraine.  She has no family history of MS.  She notes depression.  She was previously on citalopram, which was helpful.  PAST MEDICAL HISTORY: Past Medical  History  Diagnosis Date  . Multiple sclerosis ([place]) 06/19/2011  . Multiple sclerosis ([place]) 06/19/2011  . Headache     MEDICATIONS: Current Outpatient Prescriptions on File Prior to Visit  Medication Sig Dispense Refill  . Dimethyl Fumarate (TECFIDERA) 240 MG CPDR Take by mouth.    . etonogestrel (NEXPLANON) 68 MG IMPL implant 1 each by Subdermal route once.    . phenazopyridine (PYRIDIUM) 200 MG tablet Take 1 tablet (200 mg total) by mouth 3  (three) times daily as needed for pain. 30 tablet 1  . UNKNOWN TO PATIENT OTC FOAM     No current facility-administered medications on file prior to visit.    ALLERGIES: No Known Allergies  FAMILY HISTORY: Family History  Problem Relation Age of Onset  . Hypertension Mother     SOCIAL HISTORY: Social History   Social History  . Marital Status: Married    Spouse Name: N/A  . Number of Children: N/A  . Years of Education: N/A   Occupational History  . Not on file.   Social History Main Topics  . Smoking status: Never Smoker   . Smokeless tobacco: Never Used  . Alcohol Use: No  . Drug Use: No  . Sexual Activity:    Partners: Male    Birth Control/ Protection: Implant   Other Topics Concern  . Not on file   Social History Narrative    REVIEW OF SYSTEMS: Constitutional: No fevers, chills, or sweats, no generalized fatigue, change in appetite Eyes: No visual changes, double vision, eye pain Ear, nose and throat: No hearing loss, ear pain, nasal congestion, sore throat Cardiovascular: No chest pain, palpitations Respiratory:  No shortness of breath at rest or with exertion, wheezes GastrointestinaI: No nausea, vomiting, diarrhea, abdominal pain, fecal incontinence Genitourinary:  No dysuria, urinary retention or frequency Musculoskeletal:  No neck pain, back pain Integumentary: No rash, pruritus, skin lesions Neurological: as above Psychiatric: No depression, insomnia, anxiety Endocrine: No palpitations, fatigue, diaphoresis, mood swings, change in appetite, change in weight, increased thirst Hematologic/Lymphatic:  No anemia, purpura, petechiae. Allergic/Immunologic: no itchy/runny eyes, nasal congestion, recent allergic reactions, rashes  PHYSICAL EXAM: Filed Vitals:   10/19/15 0906  BP: 122/68  Pulse: 89   General: No acute distress.  Patient appears well-groomed.   Head:  Normocephalic/atraumatic Eyes:  Fundi evaluated but not visualized Neck: supple,  no paraspinal tenderness, full range of motion Heart:  Regular rate and rhythm Lungs:  Clear to auscultation bilaterally Back: No paraspinal tenderness Neurological Exam: alert and oriented to person, place, and time. Attention span and concentration intact, recent and remote memory intact, fund of knowledge intact.  Speech fluent and not dysarthric, language intact.  CN II-XII intact. Bulk and tone normal, muscle strength 5/5 except 5-/5 left deltoid and hand grip. Sensation to pinprick and vibration reduced in left arm and leg.  Deep tendon reflexes 2+ throughout, toes downgoing.  Finger to nose mildly slower on right.  Gait with right limp,Timed 25 Foot Walk 7.15 seconds.  Romberg negative.  IMPRESSION: Relapsing remitting MS Tension-type headache Visual disturbance  PLAN: 1.  It appears she did get authorization for Tecfidera.  We will get her back on right away and look into what happened and how to avoid this.  She should not have to miss doses. 2.  Prednisone taper to help break headache.  If headache persists, she will call us. 3.  Limit use of pain relievers to no more than 2 days out of the week 4.  Refer to ophthalmology 5.  Repeat CBC with diff in 6 months with follow up soon afterwards.  Metta Clines, DO  CC:  Rachell Cipro, MD

## 2015-10-19 NOTE — Addendum Note (Signed)
Addended by: Sheilah Mins A on: 10/19/2015 11:21 AM   Modules accepted: Orders

## 2015-10-19 NOTE — Progress Notes (Signed)
Chart forwarded.  

## 2016-04-16 ENCOUNTER — Telehealth: Payer: Self-pay

## 2016-04-16 DIAGNOSIS — G35 Multiple sclerosis: Secondary | ICD-10-CM

## 2016-04-16 NOTE — Telephone Encounter (Signed)
-----   Message from Drema Dallas, DO sent at 04/16/2016  7:19 AM EDT ----- Seeing patient on Monday 9/18.  She is supposed to have CBC with diff prior to follow up (has MS).  Will  Need to add LFTs as well.

## 2016-04-16 NOTE — Telephone Encounter (Signed)
Used language line [phone]) to attempt to call pt. Valentina Gu was the interpretor I spoke with, she stated that pt's number disconnected w/o a VM. Lucy then called and left vm on Daughter, Edson Snowball daughter [phone], phone.

## 2016-04-19 LAB — CBC WITH DIFFERENTIAL/PLATELET
Basophils Absolute: 97 cells/uL (ref 0–200)
Basophils Relative: 1 %
Eosinophils Absolute: 97 cells/uL (ref 15–500)
Eosinophils Relative: 1 %
HCT: 42.3 % (ref 35.0–45.0)
Hemoglobin: 14.3 g/dL (ref 11.7–15.5)
Lymphocytes Relative: 19 %
Lymphs Abs: 1843 cells/uL (ref 850–3900)
MCH: 30 pg (ref 27.0–33.0)
MCHC: 33.8 g/dL (ref 32.0–36.0)
MCV: 88.9 fL (ref 80.0–100.0)
MPV: 11.1 fL (ref 7.5–12.5)
Monocytes Absolute: 485 cells/uL (ref 200–950)
Monocytes Relative: 5 %
Neutro Abs: 7178 cells/uL (ref 1500–7800)
Neutrophils Relative %: 74 %
Platelets: 371 10*3/uL (ref 140–400)
RBC: 4.76 MIL/uL (ref 3.80–5.10)
RDW: 13.3 % (ref 11.0–15.0)
WBC: 9.7 10*3/uL (ref 3.8–10.8)

## 2016-04-19 LAB — HEPATIC FUNCTION PANEL
ALT: 11 U/L (ref 6–29)
AST: 14 U/L (ref 10–35)
Albumin: 4.3 g/dL (ref 3.6–5.1)
Alkaline Phosphatase: 77 U/L (ref 33–115)
Bilirubin, Direct: 0.1 mg/dL (ref <=0.2)
Indirect Bilirubin: 0.4 mg/dL (ref 0.2–1.2)
Total Bilirubin: 0.5 mg/dL (ref 0.2–1.2)
Total Protein: 7.3 g/dL (ref 6.1–8.1)

## 2016-04-22 ENCOUNTER — Encounter: Payer: Self-pay | Admitting: Emergency Medicine

## 2016-04-22 ENCOUNTER — Ambulatory Visit (INDEPENDENT_AMBULATORY_CARE_PROVIDER_SITE_OTHER): Payer: BLUE CROSS/BLUE SHIELD | Admitting: Neurology

## 2016-04-22 ENCOUNTER — Encounter: Payer: Self-pay | Admitting: Neurology

## 2016-04-22 VITALS — BP 102/70 | HR 70 | Ht 60.0 in | Wt 172.0 lb

## 2016-04-22 DIAGNOSIS — G35 Multiple sclerosis: Secondary | ICD-10-CM | POA: Diagnosis not present

## 2016-04-22 DIAGNOSIS — M5136 Other intervertebral disc degeneration, lumbar region: Secondary | ICD-10-CM

## 2016-04-22 NOTE — Progress Notes (Signed)
NEUROLOGY FOLLOW UP OFFICE NOTE  [name] [id number]  HISTORY OF PRESENT ILLNESS: [name] is a 45 year old right-handed Spanish-speaking woman who follows up for multiple sclerosis.  She is accompanied by her daughter who provides some history.  Interpretor is present.   UPDATE: She is taking Tecfidera.  Labs from 04/19/16 show CBC with WBC 9.7, HGB 14.3, HCT 42.3, PLT 371 and absolute lymphocyte count of 1,843; hepatic panel shows total bili 0.5, ALP 77, AST 14 and ALT 11.  Recent vitamin D level by PCP reportedly 19. She saw opthalmology for visual disturbance, which was okay.  She is to follow up for formal visual field testing.  She reports continued back pain and pain in the legs, particularly the right leg.  MRI of lumbar spine from last November revealed degenerative disc disease at L5-S1 with mild bilateral neural foraminal stenosis.  Gabapentin made her sick.   HISTORY: She was diagnosed with multiple sclerosis in 2009.  At that time, she developed right sided numbness of the face, arm and leg.  There was also associated weakness involving the arm and leg as well.  There was associated bilateral blurred vision.  There was no associated headache.  It lasted 3 or 4 days.  At first, it was believed she had a stroke.  However, MRI of the brain with and without contrast showed numerous non-specific periventricular and subcortical hyperintensities, greater than expected for age.  There was no abnormal enhancement.  She underwent lumbar puncture in February 2009.  CSF cell count was 4, protein 21, glucose 88, IgG index within normal limits and no bands.  Serum ANA and Sed Rate were negative.  She was treated in the hospital with IV Solu-Medrol   She was followed by a neurologist, Dr. Gerilyn Pilgrimoonquah.  Initially she was taking Rebif, which worked well.  However, it caused injection site reaction.  Around 2014, she was switched to Tecifedera.  She was taking Tecfedera until about  late-2015.  She changed insurance and couldn't afford it.   Between 2009 and March 2016, she had 3 similar episodes, requiring IV Solu-Medrol.  The episodes presented with right sided numbness and tingling of face, arm and leg.  There is some weakness involving the arm and leg.  It was associated with slight posterior headache.     MRI of brain and cervical spine with and without contrast performed on 12/21/14 showed stable chronic cerebral white matter lesions when compared to images from 03/18/11, and no spinal cord involvement.  It shows multiple supratentorial white matter hyperintensities without evidence of acute demyelination.  It mentions that it appears stable from prior imaging from 2009.  MRI of brain with and without contrast was performed on 07/01/15 was unchanged and showed no acute demyelinating disease.    She has no personal history of migraine.  She has no family history of MS.  She notes depression.  She was previously on citalopram, which was helpful.  PAST MEDICAL HISTORY: Past Medical History:  Diagnosis Date  . Headache   . Multiple sclerosis ([facility]) 06/19/2011  . Multiple sclerosis ([facility]) 06/19/2011    MEDICATIONS: Current Outpatient Prescriptions on File Prior to Visit  Medication Sig Dispense Refill  . Dimethyl Fumarate (TECFIDERA) 240 MG CPDR Take by mouth.    . etonogestrel (NEXPLANON) 68 MG IMPL implant 1 each by Subdermal route once.    . phenazopyridine (PYRIDIUM) 200 MG tablet Take 1 tablet (200 mg total) by mouth 3 (three) times daily  as needed for pain. 30 tablet 1  . predniSONE (DELTASONE) 10 MG tablet Take 6tabs x1day, then 5tabs x1day, then 4tabs x1day, then 3tabs x1day, then 2tabs x1day, then 1tab x1day, then STOP 21 tablet 0  . UNKNOWN TO PATIENT OTC FOAM     No current facility-administered medications on file prior to visit.     ALLERGIES: No Known Allergies  FAMILY HISTORY: Family History  Problem Relation Age of Onset  . Hypertension Mother      SOCIAL HISTORY: Social History   Social History  . Marital status: Married    Spouse name: N/A  . Number of children: N/A  . Years of education: N/A   Occupational History  . Not on file.   Social History Main Topics  . Smoking status: Never Smoker  . Smokeless tobacco: Never Used  . Alcohol use No  . Drug use: No  . Sexual activity: Yes    Partners: Male    Birth control/ protection: Implant   Other Topics Concern  . Not on file   Social History Narrative  . No narrative on file    REVIEW OF SYSTEMS: Constitutional: No fevers, chills, or sweats, no generalized fatigue, change in appetite Eyes: No visual changes, double vision, eye pain Ear, nose and throat: No hearing loss, ear pain, nasal congestion, sore throat Cardiovascular: No chest pain, palpitations Respiratory:  No shortness of breath at rest or with exertion, wheezes GastrointestinaI: No nausea, vomiting, diarrhea, abdominal pain, fecal incontinence Genitourinary:  No dysuria, urinary retention or frequency Musculoskeletal:  back pain Integumentary: No rash, pruritus, skin lesions Neurological: as above Psychiatric: No depression, insomnia, anxiety Endocrine: No palpitations, fatigue, diaphoresis, mood swings, change in appetite, change in weight, increased thirst Hematologic/Lymphatic:  No purpura, petechiae. Allergic/Immunologic: no itchy/runny eyes, nasal congestion, recent allergic reactions, rashes  PHYSICAL EXAM: Vitals:   04/22/16 0940  BP: 102/70  Pulse: 70   General: No acute distress.  Patient appears well-groomed.  normal body habitus. Head:  Normocephalic/atraumatic Eyes:  Fundi examined but not visualized Neck: supple, no paraspinal tenderness, full range of motion Heart:  Regular rate and rhythm Lungs:  Clear to auscultation bilaterally Back: No paraspinal tenderness Neurological Exam: alert and oriented to person, place, and time. Attention span and concentration intact, recent  and remote memory intact, fund of knowledge intact.  Speech fluent and not dysarthric, language intact.  Decreased sensation left V1 (even with tuning fork) and right V2-V3.  Otherwise, CN II-XII intact. Bulk and tone normal, muscle strength 5/5 except 5-/5 right deltoid and hand grip and  hip. Sensation to pinprick and vibration reduced in left arm and leg.  Deep tendon reflexes 2+ throughout, toes downgoing.  Finger to nose mildly slower on right.  Gait with right limp,Timed 25 Foot Walk 8.23 seconds.  Romberg negative.  IMPRESSION: Relapsing-remitting MS.  Some of her symptoms on exam seem functional. Degenerative disc disease of lumbar region, which could be contributing to back pain  PLAN: 1.  We will refer to pain specialist for back pain. 2.  Continue Tecfidera 3.  Start vitamin D3 4000 IU daily. 4.  Repeat MRI of brain with and without contrast, CBC with diff, hepatic panel and vitamin D in 6 months (just prior to follow up) 5.  Follow up in 6 months soon after repeat testing.  26 minutes spent face to face with patient, over 50% spent counseling.  Shon Millet, DO  CC:  Maryelizabeth Rowan, MD

## 2016-04-22 NOTE — Progress Notes (Signed)
Referral sent to the [facility].   Cancelled referral to the [facility] and sent it to [facility], Dr. Ethelene Hal. Per Dr. Everlena Cooper.

## 2016-04-22 NOTE — Patient Instructions (Signed)
1.  We will refer you to pain specialist for back pain. 2.  Continue Tecfidera 3.  Start vitamin D3 4000 IU daily. 4.  Repeat MRI of brain with and without contrast, CBC with diff, hepatic panel and vitamin D in 6 months (just prior to follow up) 5.  Follow up in 6 months soon after repeat testing.

## 2016-04-24 ENCOUNTER — Telehealth: Payer: Self-pay

## 2016-04-24 NOTE — Telephone Encounter (Signed)
Pt to see Dr. Ethelene Hal 05/23/16 @10 :[address] [facility] - [address], [place], [place], [address] [facility]: [phone] Fax: [phone]

## 2016-06-04 ENCOUNTER — Encounter (HOSPITAL_COMMUNITY): Payer: Self-pay | Admitting: Physical Therapy

## 2016-06-04 ENCOUNTER — Ambulatory Visit (HOSPITAL_COMMUNITY): Payer: BLUE CROSS/BLUE SHIELD | Attending: Physical Medicine and Rehabilitation | Admitting: Physical Therapy

## 2016-06-04 DIAGNOSIS — R2689 Other abnormalities of gait and mobility: Secondary | ICD-10-CM | POA: Diagnosis present

## 2016-06-04 DIAGNOSIS — M545 Low back pain: Secondary | ICD-10-CM | POA: Insufficient documentation

## 2016-06-04 DIAGNOSIS — M6281 Muscle weakness (generalized): Secondary | ICD-10-CM | POA: Insufficient documentation

## 2016-06-04 DIAGNOSIS — G8929 Other chronic pain: Secondary | ICD-10-CM | POA: Diagnosis present

## 2016-06-04 NOTE — Therapy (Signed)
[facility] [address] [place], [place], [address] Phone: [phone]   Fax:  [phone]  Physical Therapy Evaluation  Patient Details  Name: [name] MRN: [id number] Date of Birth: 1970/09/09 Referring Provider: Maryelizabeth Rowan, MD  Encounter Date: 06/04/2016      PT End of Session - 06/04/16 1658    Visit Number 1   Number of Visits 12   Date for PT Re-Evaluation 06/25/16   Authorization Type BCBS   Authorization Time Period 06/04/16 to 07/23/16   PT Start Time 1601   PT Stop Time 1645   PT Time Calculation (min) 44 min   Activity Tolerance Patient tolerated treatment well;No increased pain   Behavior During Therapy WFL for tasks assessed/performed      Past Medical History:  Diagnosis Date  . Headache   . Multiple sclerosis (HCC) 06/19/2011  . Multiple sclerosis (HCC) 06/19/2011    History reviewed. No pertinent surgical history.  There were no vitals filed for this visit.       Subjective Assessment - 06/04/16 1559    Subjective Pt reports having LBP pain for several years which has increased recently and she now has Rt leg pain which began 3 months ago out of nowhere. She saw her PCP who referred her to PT. She denies N/T, and bowel/bladder function remains normal. She feels overall her pain is about the same from its onset.  Pain is worse with sitting and improves when standing.   Patient is accompained by: Interpreter  [name]   Pertinent History MS for several years now    Limitations Other (comment);House hold activities  sweeping   How long can you sit comfortably? 5-10 mintues    How long can you stand comfortably? 30 minutes    How long can you walk comfortably? 10 minutes    Diagnostic tests Xray on back: something about the disc   Patient Stated Goals improved pain to increase activity tolerance    Currently in Pain? Yes   Pain Score 5    Pain Location Back   Pain Descriptors /  Indicators Aching;Dull   Pain Type Chronic pain   Pain Radiating Towards front of thigh and down to the knee    Pain Onset More than a month ago   Pain Frequency Constant   Aggravating Factors  sitting, sweeping, bending over   Pain Relieving Factors laying on her sides    Effect of Pain on Daily Activities unsure             [facility] PT Assessment - 06/04/16 0001      Assessment   Medical Diagnosis Rt leg pain   Referring Provider Maryelizabeth Rowan, MD   Onset Date/Surgical Date --  back ~3 years ago, Leg pain ~2-3 weeks ago   Next MD Visit 06/24/16 (approximate)   Prior Therapy None      Precautions   Precautions None     Restrictions   Weight Bearing Restrictions No     Balance Screen   Has the patient fallen in the past 6 months No   Has the patient had a decrease in activity level because of a fear of falling?  Yes   Is the patient reluctant to leave their home because of a fear of falling?  No     Home [employer] information centre manager residence     Prior Function   Level of Independence Independent   Vocation Unemployed  Leisure taking care of 3 grandchildren       Cognition   Overall Cognitive Status Within Functional Limits for tasks assessed     Observation/Other Assessments   Observations repositioning and leaning to Lt during session     Sensation   Light Touch Appears Intact     Posture/Postural Control   Posture/Postural Control Postural limitations   Postural Limitations Rounded Shoulders;Forward head;Decreased lumbar lordosis;Increased thoracic kyphosis;Weight shift left     ROM / Strength   AROM / PROM / Strength AROM;Strength     AROM   AROM Assessment Site Lumbar   Lumbar Flexion WNL, pain coming back up  increased back and periheralization into RLE with repeated flexion x10   Lumbar Extension WNL, pain with end      Strength   Strength Assessment Site Hip;Ankle;Knee   Right/Left Hip Right;Left   Right Hip Flexion 4+/5   Right  Hip Extension 3+/5   Right Hip ABduction 3+/5   Left Hip Flexion 4+/5   Left Hip Extension 3+/5   Left Hip ABduction 3+/5   Right/Left Knee Right;Left   Right Knee Flexion 5/5   Right Knee Extension 5/5   Left Knee Flexion 5/5   Left Knee Extension 5/5   Right/Left Ankle Right;Left   Right Ankle Dorsiflexion 5/5   Left Ankle Dorsiflexion 5/5     Palpation   Palpation comment TTP B QL (Rt>Lt), Lt piriformis, Rt TFL/Glute med      Special Tests    Special Tests Sacrolliac Tests;Lumbar   Lumbar Tests Slump Test;Prone Knee Bend Test   Sacroiliac Tests  --  SI testing negative      Slump test   Findings Positive   Side Right     Prone Knee Bend Test   Findings Negative     Transfers   Five time sit to stand comments  25.7 sec, no UE     Ambulation/Gait   Pre-Gait Activities Ascend 6" steps with UE support on RLE, reciprocal pattern; Descend 6" steps with Lt step to pattern and 1 handrail. Reviewed technique and encouraged reciprocal pattern   Gait Comments decreased speed, decreased stance time on Lt                    [facility] Adult PT Treatment/Exercise - 06/04/16 0001      Self-Care   Self-Care Posture   Posture Use of lumbar roll at home. Demonstrated set up and encouraged consistent use to ease back pain      Exercises   Exercises Lumbar     Lumbar Exercises: Stretches   Standing Extension Limitations x15 reps with back against counter     Lumbar Exercises: Supine   Bridge Other (comment)  attempted but pt with increased back pain at this time                 PT Education - 06/04/16 1655    Education provided Yes   Education Details eval findings/POC; initiated HEP and discussed directional preference of extension; importance of improve posture and hip/trunk strength in her daily activity to decrease strain on the back   Person(s) Educated Patient   Methods Explanation;Handout;Verbal cues;Demonstration;Other (comment)  via spanish  interpreter   Comprehension Verbalized understanding;Returned demonstration          PT Short Term Goals - 06/04/16 1708      PT SHORT TERM GOAL #1   Title Pt will demo consistency and independence with her HEP to improve  overall pain and LE strength.    Time 2   Period Weeks   Status New     PT SHORT TERM GOAL #2   Title Pt will demo correct set up and use of lumbar roll to improve her sitting tolerance.    Time 2   Period Weeks   Status New     PT SHORT TERM GOAL #3   Title Pt will demo correct log roll technique when transitioning in/out of bed and the mat table to decrease strain on her back musculature.    Time 3   Period Weeks   Status New           PT Long Term Goals - 06/04/16 1709      PT LONG TERM GOAL #1   Title Pt will demo improved BLE strength to atleast 4/5 MMT to improve her safety with stair negotiation.   Time 6   Status New     PT LONG TERM GOAL #2   Title pt will demo improved postural awareness evident by her ability to maintain upright posture atleast 50% of the session without cues from the therapist.    Time 6   Period Weeks   Status New     PT LONG TERM GOAL #3   Title Pt will perform 5x sit to stand in less than 17sec, to demonstrate an improvemetn in functional strength.    Time 6   Period Weeks   Status New     PT LONG TERM GOAL #4   Title Pt will demo proper lifting mechanics evident by her ability to lift a small box off the floor without cues from therapist for correct technique, x5 trials.   Time 6   Period Weeks   Status New     PT LONG TERM GOAL #5   Title Pt will report no more than 4/10 pain during her daily activity, to allow her to perform housework without signfiicant pain/discomfort.    Time 6   Period Weeks   Status New               Plan - 06/04/16 1659    Clinical Impression Statement Pt is a pleasant 45yo F presenting to OPPT with chronic LBP and recent onset of RLE pain that for the most part has  remained unchanged. She demonstrates postural limitations of flexed trunk, forward shoulders and pain which is increased with flexion based activities such as bending forward and sitting for any amount of time. Her lumbar ROM is WFL, with noted peripheralization of symptoms with repeated flexion and centralization with repeated extension in standing, indicating preference for extension based exercises. She also demonstrates significant weakness in B hips which is evident on functional testing of 5x sit to stand and during stair negotiation requiring increased time to complete. I discussed these findings with the pt via Spanish interpreter and initiated her HEP with education concerning posture awareness and performance of repeated extension to decrease her pain throughout the day. She demonstrated understanding of this and is in agreement with proposed PT POC and frequency. She would benefit from skilled PT services to address her limitations listed and improve her activity tolerance and quality of life.   Rehab Potential Good   PT Frequency 2x / week   PT Duration 6 weeks   PT Treatment/Interventions ADLs/Self Care Home Management;Moist Heat;Aquatic Therapy;Therapeutic exercise;Therapeutic activities;Functional mobility training;Stair training;Gait training;Balance training;Neuromuscular re-education;Patient/family education;Manual techniques;Passive range of motion   PT Next Visit Plan address  general mechanics with lifting; repeated extension in prone; trunk stabilization in supine (ab set, ab set with bent knee raise, etc), hip strength (clamshells, hip ext isometrics, etc.)    PT Home Exercise Plan lumbar roll, REIS   Recommended Other Services none    Consulted and Agree with Plan of Care Patient      Patient will benefit from skilled therapeutic intervention in order to improve the following deficits and impairments:  Abnormal gait, Decreased activity tolerance, Decreased strength, Impaired  flexibility, Postural dysfunction, Pain, Improper body mechanics, Decreased range of motion, Increased muscle spasms  Visit Diagnosis: Chronic low back pain, unspecified back pain laterality, with sciatica presence unspecified  Muscle weakness (generalized)  Other abnormalities of gait and mobility     Problem List Patient Active Problem List   Diagnosis Date Noted  . Relapsing remitting multiple sclerosis (HCC) 10/19/2015  . Nexplanon insertion 05/15/2015  . Multiple sclerosis (HCC) 06/19/2011   5:17 PM,06/04/16 [name] PT, DPT [name] [phone]  [facility] [address] [place], [place], [address] Phone: [phone]   Fax:  [phone]  Name: [name] MRN: [id number] Date of Birth: 09/23/1970

## 2016-06-19 ENCOUNTER — Ambulatory Visit (HOSPITAL_COMMUNITY): Payer: BLUE CROSS/BLUE SHIELD | Admitting: Physical Therapy

## 2016-07-01 ENCOUNTER — Ambulatory Visit (HOSPITAL_COMMUNITY): Payer: BLUE CROSS/BLUE SHIELD | Attending: Physical Medicine and Rehabilitation | Admitting: Physical Therapy

## 2016-07-01 DIAGNOSIS — M6281 Muscle weakness (generalized): Secondary | ICD-10-CM | POA: Diagnosis present

## 2016-07-01 DIAGNOSIS — G8929 Other chronic pain: Secondary | ICD-10-CM | POA: Diagnosis present

## 2016-07-01 DIAGNOSIS — R2689 Other abnormalities of gait and mobility: Secondary | ICD-10-CM | POA: Insufficient documentation

## 2016-07-01 DIAGNOSIS — M545 Low back pain: Secondary | ICD-10-CM | POA: Diagnosis present

## 2016-07-01 NOTE — Therapy (Signed)
[facility] [address] [place], [place], [address] Phone: [phone]   Fax:  [phone]  Physical Therapy Treatment  Patient Details  Name: [name] MRN: [id number] Date of Birth: 01/10/1971 Referring Provider: Bradley Ferris, MD  Encounter Date: 07/01/2016      PT End of Session - 07/01/16 1652    Visit Number 2   Number of Visits 12   Date for PT Re-Evaluation 06/25/16   Authorization Type BCBS   Authorization Time Period 06/04/16 to 07/23/16   PT Start Time 1647   PT Stop Time 1727   PT Time Calculation (min) 40 min   Activity Tolerance Patient tolerated treatment well;No increased pain   Behavior During Therapy WFL for tasks assessed/performed      Past Medical History:  Diagnosis Date  . Headache   . Multiple sclerosis (HCC) 06/19/2011  . Multiple sclerosis (HCC) 06/19/2011    No past surgical history on file.  There were no vitals filed for this visit.      Subjective Assessment - 07/01/16 1650    Subjective Pt reports that her back is only bothering her minimally currently.    Patient is accompained by: Interpreter  Ethelene Hal   Pertinent History MS for several years now    Limitations Other (comment);House hold activities  sweeping   How long can you sit comfortably? 5-10 mintues    How long can you stand comfortably? 30 minutes    How long can you walk comfortably? 10 minutes    Diagnostic tests Xray on back: something about the disc   Patient Stated Goals improved pain to increase activity tolerance    Pain Score 7    Pain Location Back   Pain Descriptors / Indicators Aching   Pain Type Chronic pain   Pain Onset More than a month ago   Pain Frequency Constant   Aggravating Factors  bending over    Pain Relieving Factors unsure                         [facility] Adult PT Treatment/Exercise - 07/01/16 0001      Self-Care   Self-Care Lifting   Lifting lifting small  box from the floor with visual and verbal cues for improved techni     Lumbar Exercises: Standing   Heel Raises 20 reps   Wall Slides 10 reps   Wall Slides Limitations x2 sets      Lumbar Exercises: Supine   Bent Knee Raise 15 reps   Bent Knee Raise Limitations with ab set    Bridge 10 reps   Other Supine Lumbar Exercises ab set with bent knee lower to the side x15 each   verbal cues to slow down     Lumbar Exercises: Sidelying   Clam 15 reps   Clam Limitations red TB     Lumbar Exercises: Prone   Straight Leg Raise 10 reps                PT Education - 07/01/16 1701    Education provided Yes   Education Details encouraged pt to schedule visits; technique with therex; proper lifting technique   Person(s) Educated Patient   Methods Explanation;Other (comment)  via spanish interpreter   Comprehension Verbalized understanding          PT Short Term Goals - 06/04/16 1708      PT SHORT TERM GOAL #1   Title  Pt will demo consistency and independence with her HEP to improve overall pain and LE strength.    Time 2   Period Weeks   Status New     PT SHORT TERM GOAL #2   Title Pt will demo correct set up and use of lumbar roll to improve her sitting tolerance.    Time 2   Period Weeks   Status New     PT SHORT TERM GOAL #3   Title Pt will demo correct log roll technique when transitioning in/out of bed and the mat table to decrease strain on her back musculature.    Time 3   Period Weeks   Status New           PT Long Term Goals - 06/04/16 1709      PT LONG TERM GOAL #1   Title Pt will demo improved BLE strength to atleast 4/5 MMT to improve her safety with stair negotiation.   Time 6   Status New     PT LONG TERM GOAL #2   Title pt will demo improved postural awareness evident by her ability to maintain upright posture atleast 50% of the session without cues from the therapist.    Time 6   Period Weeks   Status New     PT LONG TERM GOAL #3    Title Pt will perform 5x sit to stand in less than 17sec, to demonstrate an improvemetn in functional strength.    Time 6   Period Weeks   Status New     PT LONG TERM GOAL #4   Title Pt will demo proper lifting mechanics evident by her ability to lift a small box off the floor without cues from therapist for correct technique, x5 trials.   Time 6   Period Weeks   Status New     PT LONG TERM GOAL #5   Title Pt will report no more than 4/10 pain during her daily activity, to allow her to perform housework without signfiicant pain/discomfort.    Time 6   Period Weeks   Status New               Plan - 07/01/16 1727    Clinical Impression Statement Pt arrived today after ~1 month since her initial evaluation, reporting minimal back pain that overall has improved some with performance of her HEP. Session focused on more therex to improve LE and trunk strength with pt requiring verbal cues to move at a steady pace. Also addressed lifting mechanics, as this is the pt's primary source of pain. She demonstrates trunk flexion placing increased strain on her lumbar paraspinals and increasing pain. I encouraged her to use her hips and maintain proper trunk alignment with difficulty secondary to hip weakness. Will continue to review this in future sessions.    Rehab Potential Good   PT Frequency 2x / week   PT Duration 6 weeks   PT Treatment/Interventions ADLs/Self Care Home Management;Moist Heat;Aquatic Therapy;Therapeutic exercise;Therapeutic activities;Functional mobility training;Stair training;Gait training;Balance training;Neuromuscular re-education;Patient/family education;Manual techniques;Passive range of motion   PT Next Visit Plan general trunk and LE strengthening; lifting mechanics and postural awareness/strengthening    PT Home Exercise Plan lumbar roll, REIS   Consulted and Agree with Plan of Care Patient      Patient will benefit from skilled therapeutic intervention in order  to improve the following deficits and impairments:  Abnormal gait, Decreased activity tolerance, Decreased strength, Impaired flexibility, Postural dysfunction, Pain, Improper  body mechanics, Decreased range of motion, Increased muscle spasms  Visit Diagnosis: Chronic low back pain, unspecified back pain laterality, with sciatica presence unspecified  Muscle weakness (generalized)  Other abnormalities of gait and mobility     Problem List Patient Active Problem List   Diagnosis Date Noted  . Relapsing remitting multiple sclerosis (HCC) 10/19/2015  . Nexplanon insertion 05/15/2015  . Multiple sclerosis (HCC) 06/19/2011   5:36 PM,07/01/16 Marylyn IshiharaSara Kiser PT, DPT [facility] [phone]  [facility] [address] [place], [place], [address] Phone: [phone]   Fax:  [phone]  Name: [name] MRN: [id number] Date of Birth: 12/01/1970

## 2016-07-09 ENCOUNTER — Telehealth (HOSPITAL_COMMUNITY): Payer: Self-pay | Admitting: Physical Therapy

## 2016-07-09 ENCOUNTER — Ambulatory Visit (HOSPITAL_COMMUNITY): Payer: BLUE CROSS/BLUE SHIELD | Attending: Physical Medicine and Rehabilitation | Admitting: Physical Therapy

## 2016-07-09 DIAGNOSIS — M545 Low back pain: Secondary | ICD-10-CM | POA: Insufficient documentation

## 2016-07-09 DIAGNOSIS — G8929 Other chronic pain: Secondary | ICD-10-CM | POA: Diagnosis present

## 2016-07-09 DIAGNOSIS — M6281 Muscle weakness (generalized): Secondary | ICD-10-CM | POA: Insufficient documentation

## 2016-07-09 DIAGNOSIS — R2689 Other abnormalities of gait and mobility: Secondary | ICD-10-CM | POA: Diagnosis present

## 2016-07-09 NOTE — Telephone Encounter (Signed)
Pt requested to have female interpreters only. NF 07/09/16

## 2016-07-09 NOTE — Therapy (Signed)
[facility] [address] [place], [place], [address] Phone: [phone]   Fax:  [phone]  Physical Therapy Treatment  Patient Details  Name: [name] MRN: [id number] Date of Birth: 02-10-1971 Referring Provider: Bradley Ferris, MD  Encounter Date: 07/09/2016      PT End of Session - 07/09/16 1725    Visit Number 3   Number of Visits 12   Date for PT Re-Evaluation 06/25/16   Authorization Type BCBS   Authorization Time Period 06/04/16 to 07/23/16   PT Start Time 1655   PT Stop Time 1725   PT Time Calculation (min) 30 min   Activity Tolerance Patient tolerated treatment well;No increased pain   Behavior During Therapy WFL for tasks assessed/performed      Past Medical History:  Diagnosis Date  . Headache   . Multiple sclerosis (HCC) 06/19/2011  . Multiple sclerosis (HCC) 06/19/2011    No past surgical history on file.  There were no vitals filed for this visit.      Subjective Assessment - 07/09/16 1730    Subjective Per interpreter, 75% better.  Reports pain at 7/10 in lumbar with occasional radiating pain into Rt LE to knee level only.  Compliant with HEP 2X daily.   Patient is accompained by: Interpreter Maretta Los)                         [facility] Adult PT Treatment/Exercise - 07/09/16 0001      Lumbar Exercises: Stretches   Active Hamstring Stretch 3 reps;30 seconds   Active Hamstring Stretch Limitations supine with rope   Piriformis Stretch 2 reps;60 seconds   Piriformis Stretch Limitations seated bilaterally     Lumbar Exercises: Standing   Heel Raises 20 reps   Wall Slides 10 reps   Wall Slides Limitations x2 sets    Other Standing Lumbar Exercises standing extension 10 reps   Other Standing Lumbar Exercises wall arch 10 reps     Lumbar Exercises: Seated   Sit to Stand 10 reps   Sit to Stand Limitations no UE's     Lumbar Exercises: Supine   Bent Knee Raise 15  reps   Bent Knee Raise Limitations with ab set    Bridge 15 reps   Straight Leg Raise 10 reps   Straight Leg Raises Limitations bilaterally with abdominal set   Other Supine Lumbar Exercises ab set with bent knee lower to the side x15 each      Lumbar Exercises: Sidelying   Hip Abduction 15 reps     Lumbar Exercises: Prone   Straight Leg Raise 10 reps   Other Prone Lumbar Exercises PRONE ON ELBOWS 2 MINUTES                  PT Short Term Goals - 06/04/16 1708      PT SHORT TERM GOAL #1   Title Pt will demo consistency and independence with her HEP to improve overall pain and LE strength.    Time 2   Period Weeks   Status New     PT SHORT TERM GOAL #2   Title Pt will demo correct set up and use of lumbar roll to improve her sitting tolerance.    Time 2   Period Weeks   Status New     PT SHORT TERM GOAL #3   Title Pt will demo correct log roll technique when transitioning in/out of  bed and the mat table to decrease strain on her back musculature.    Time 3   Period Weeks   Status New           PT Long Term Goals - 06/04/16 1709      PT LONG TERM GOAL #1   Title Pt will demo improved BLE strength to atleast 4/5 MMT to improve her safety with stair negotiation.   Time 6   Status New     PT LONG TERM GOAL #2   Title pt will demo improved postural awareness evident by her ability to maintain upright posture atleast 50% of the session without cues from the therapist.    Time 6   Period Weeks   Status New     PT LONG TERM GOAL #3   Title Pt will perform 5x sit to stand in less than 17sec, to demonstrate an improvemetn in functional strength.    Time 6   Period Weeks   Status New     PT LONG TERM GOAL #4   Title Pt will demo proper lifting mechanics evident by her ability to lift a small box off the floor without cues from therapist for correct technique, x5 trials.   Time 6   Period Weeks   Status New     PT LONG TERM GOAL #5   Title Pt will report  no more than 4/10 pain during her daily activity, to allow her to perform housework without signfiicant pain/discomfort.    Time 6   Period Weeks   Status New               Plan - 07/09/16 1725    Clinical Impression Statement continued with focus on improving core stability and overall LE strength.  Pt with constant smile, appeared to be in no distress or discomfort during session.  Added hamstring and pirformis stretches to POC without pain, however noted tightness in Rt LE vs Lt.  Also added wall arches to improve postural strengthening.  Pt able to complete all exercises with reports of reduced pain to 5/10 at end of session.    Rehab Potential Good   PT Frequency 2x / week   PT Duration 6 weeks   PT Treatment/Interventions ADLs/Self Care Home Management;Moist Heat;Aquatic Therapy;Therapeutic exercise;Therapeutic activities;Functional mobility training;Stair training;Gait training;Balance training;Neuromuscular re-education;Patient/family education;Manual techniques;Passive range of motion   PT Next Visit Plan general trunk and LE strengthening; lifting mechanics and postural awareness/strengthening    PT Home Exercise Plan lumbar roll, REIS   Consulted and Agree with Plan of Care Patient      Patient will benefit from skilled therapeutic intervention in order to improve the following deficits and impairments:  Abnormal gait, Decreased activity tolerance, Decreased strength, Impaired flexibility, Postural dysfunction, Pain, Improper body mechanics, Decreased range of motion, Increased muscle spasms  Visit Diagnosis: Chronic low back pain, unspecified back pain laterality, with sciatica presence unspecified  Muscle weakness (generalized)  Other abnormalities of gait and mobility     Problem List Patient Active Problem List   Diagnosis Date Noted  . Relapsing remitting multiple sclerosis (HCC) 10/19/2015  . Nexplanon insertion 05/15/2015  . Multiple sclerosis (HCC)  06/19/2011    Lurena Nidamy B Angeli Demilio, PTA/CLT [phone]  07/09/2016, 5:30 PM  [facility] [address] [place], [place], [address] Phone: [phone]   Fax:  [phone]  Name: [name] MRN: [id number] Date of Birth: 01/11/1971

## 2016-07-11 ENCOUNTER — Ambulatory Visit (HOSPITAL_COMMUNITY): Payer: BLUE CROSS/BLUE SHIELD

## 2016-07-11 DIAGNOSIS — G8929 Other chronic pain: Secondary | ICD-10-CM

## 2016-07-11 DIAGNOSIS — M545 Low back pain: Secondary | ICD-10-CM | POA: Diagnosis not present

## 2016-07-11 DIAGNOSIS — R2689 Other abnormalities of gait and mobility: Secondary | ICD-10-CM

## 2016-07-11 DIAGNOSIS — M6281 Muscle weakness (generalized): Secondary | ICD-10-CM

## 2016-07-11 NOTE — Therapy (Signed)
[facility] [address] [place], [place], [address] Phone: [phone]   Fax:  [phone]  Physical Therapy Treatment  Patient Details  Name: [name] MRN: [id number] Date of Birth: 12/01/1970 Referring Provider: Bradley FerrisElixabeth Dewey, MD  Encounter Date: 07/11/2016      PT End of Session - 07/11/16 1655    Visit Number 4   Number of Visits 12   Authorization Type BCBS   Authorization Time Period 06/04/16 to 07/23/16   PT Start Time 1646   PT Stop Time 1727   PT Time Calculation (min) 41 min   Activity Tolerance Patient tolerated treatment well;No increased pain   Behavior During Therapy WFL for tasks assessed/performed      Past Medical History:  Diagnosis Date  . Headache   . Multiple sclerosis (HCC) 06/19/2011  . Multiple sclerosis (HCC) 06/19/2011    No past surgical history on file.  There were no vitals filed for this visit.      Subjective Assessment - 07/11/16 1652    Subjective Pt stated current pain scale 4/10 tenderness on Rt lumbar region.  Reports compliance with HEP daily.     Patient is accompained by: Interpreter;Family member  Interpreter Maretta Los(Blanca Lindner)   Pertinent History MS for several years now    Patient Stated Goals improved pain to increase activity tolerance    Currently in Pain? Yes   Pain Score 4    Pain Location Back   Pain Orientation Right;Lower   Pain Descriptors / Indicators Aching;Tender   Pain Type Chronic pain   Pain Radiating Towards Intermittent radiating to Rt thigh   Pain Onset More than a month ago   Pain Frequency Intermittent   Aggravating Factors  bending over   Pain Relieving Factors unsure   Effect of Pain on Daily Activities unsure                         [facility] Adult PT Treatment/Exercise - 07/11/16 0001      Lumbar Exercises: Stretches   Standing Extension Limitations x15 reps with back against counter   Prone on Elbows Stretch --  2  minutes   Prone on Elbows Stretch Limitations POE x 2 min   Piriformis Stretch 2 reps;30 seconds   Piriformis Stretch Limitations seated bilaterally     Lumbar Exercises: Standing   Heel Raises 20 reps   Wall Slides 10 reps   Wall Slides Limitations x2 sets    Other Standing Lumbar Exercises weight distribution 10x; standing extension   Other Standing Lumbar Exercises wall arch 10 reps     Lumbar Exercises: Supine   Bent Knee Raise 15 reps   Bent Knee Raise Limitations with ab set    Bridge 15 reps   Straight Leg Raise 10 reps   Straight Leg Raises Limitations bilaterally with abdominal set   Other Supine Lumbar Exercises ab set with bent knee lower to the side x15 each      Lumbar Exercises: Sidelying   Hip Abduction 15 reps   Hip Abduction Weights (lbs) cueing for form                  PT Short Term Goals - 06/04/16 1708      PT SHORT TERM GOAL #1   Title Pt will demo consistency and independence with her HEP to improve overall pain and LE strength.    Time 2   Period Weeks  Status New     PT SHORT TERM GOAL #2   Title Pt will demo correct set up and use of lumbar roll to improve her sitting tolerance.    Time 2   Period Weeks   Status New     PT SHORT TERM GOAL #3   Title Pt will demo correct log roll technique when transitioning in/out of bed and the mat table to decrease strain on her back musculature.    Time 3   Period Weeks   Status New           PT Long Term Goals - 06/04/16 1709      PT LONG TERM GOAL #1   Title Pt will demo improved BLE strength to atleast 4/5 MMT to improve her safety with stair negotiation.   Time 6   Status New     PT LONG TERM GOAL #2   Title pt will demo improved postural awareness evident by her ability to maintain upright posture atleast 50% of the session without cues from the therapist.    Time 6   Period Weeks   Status New     PT LONG TERM GOAL #3   Title Pt will perform 5x sit to stand in less than  17sec, to demonstrate an improvemetn in functional strength.    Time 6   Period Weeks   Status New     PT LONG TERM GOAL #4   Title Pt will demo proper lifting mechanics evident by her ability to lift a small box off the floor without cues from therapist for correct technique, x5 trials.   Time 6   Period Weeks   Status New     PT LONG TERM GOAL #5   Title Pt will report no more than 4/10 pain during her daily activity, to allow her to perform housework without signfiicant pain/discomfort.    Time 6   Period Weeks   Status New               Plan - 07/11/16 1714    Clinical Impression Statement Session fouc on improving core stabilty and proximal musculature strengtheing.  Pt was accompanied by daughter and Interpreter Maretta Los) through session.  Pt with constant smile and did not appear to be in any distress/discomfort through session.  Therapist facilitation for proper form and technique wtih therex as well as education on importance of proper posture and interpreter assistance for communication.  No reports of increased pain through session.    Rehab Potential Good   PT Frequency 2x / week   PT Duration 6 weeks   PT Treatment/Interventions ADLs/Self Care Home Management;Moist Heat;Aquatic Therapy;Therapeutic exercise;Therapeutic activities;Functional mobility training;Stair training;Gait training;Balance training;Neuromuscular re-education;Patient/family education;Manual techniques;Passive range of motion   PT Next Visit Plan general trunk and LE strengthening; lifting mechanics and postural awareness/strengthening.  Begin postural strengthening next session.      Patient will benefit from skilled therapeutic intervention in order to improve the following deficits and impairments:  Abnormal gait, Decreased activity tolerance, Decreased strength, Impaired flexibility, Postural dysfunction, Pain, Improper body mechanics, Decreased range of motion, Increased muscle  spasms  Visit Diagnosis: Chronic low back pain, unspecified back pain laterality, with sciatica presence unspecified  Muscle weakness (generalized)  Other abnormalities of gait and mobility     Problem List Patient Active Problem List   Diagnosis Date Noted  . Relapsing remitting multiple sclerosis (HCC) 10/19/2015  . Nexplanon insertion 05/15/2015  . Multiple sclerosis (HCC) 06/19/2011  [address] LPTA; CBIS [phone]  Juel Burrow 07/11/2016, 5:31 PM  [place] [facility] [address] [place], [place], [address] Phone: [phone]   Fax:  [phone]  Name: Nani Ingram MRN: [id number] Date of Birth: 1970/09/05

## 2016-07-15 ENCOUNTER — Ambulatory Visit (HOSPITAL_COMMUNITY): Payer: BLUE CROSS/BLUE SHIELD | Admitting: Physical Therapy

## 2016-07-15 DIAGNOSIS — G8929 Other chronic pain: Secondary | ICD-10-CM

## 2016-07-15 DIAGNOSIS — M545 Low back pain: Secondary | ICD-10-CM | POA: Diagnosis not present

## 2016-07-15 DIAGNOSIS — M6281 Muscle weakness (generalized): Secondary | ICD-10-CM

## 2016-07-15 DIAGNOSIS — R2689 Other abnormalities of gait and mobility: Secondary | ICD-10-CM

## 2016-07-15 NOTE — Therapy (Signed)
[facility] [address] [place], [place], [address] Phone: [phone]   Fax:  [phone]  Physical Therapy Treatment  Patient Details  Name: [name] MRN: [id number] Date of Birth: 07/13/1971 Referring Provider: Bradley FerrisElixabeth Dewey, MD  Encounter Date: 07/15/2016      PT End of Session - 07/15/16 1622    Visit Number 5   Number of Visits 12   Authorization Type BCBS   Authorization Time Period 06/04/16 to 07/23/16   PT Start Time 1602   PT Stop Time 1640   PT Time Calculation (min) 38 min   Activity Tolerance Patient tolerated treatment well;No increased pain   Behavior During Therapy WFL for tasks assessed/performed      Past Medical History:  Diagnosis Date  . Headache   . Multiple sclerosis (HCC) 06/19/2011  . Multiple sclerosis (HCC) 06/19/2011    No past surgical history on file.  There were no vitals filed for this visit.      Subjective Assessment - 07/15/16 1606    Subjective Pt states she was moving light furniture and items from one room to another over the weekend and it flaired her pain up to 8/10   Patient is accompained by: Interpreter  Albertina SenegalMarly Adams   Currently in Pain? Yes   Pain Score 8    Pain Location Back   Pain Orientation Right;Lower   Pain Descriptors / Indicators Aching;Tender                         [facility] Adult PT Treatment/Exercise - 07/15/16 0001      Lumbar Exercises: Stretches   Active Hamstring Stretch 3 reps;30 seconds   Active Hamstring Stretch Limitations supine with rope   Standing Extension Limitations x15 reps with back against counter   Prone on Elbows Stretch Limitations POE x 2 min   Piriformis Stretch 2 reps;30 seconds   Piriformis Stretch Limitations seated bilaterally     Lumbar Exercises: Standing   Heel Raises 20 reps   Forward Lunge 15 reps   Forward Lunge Limitations onto 4" box wtihout UE assist   Wall Slides 15 reps   Wall Slides  Limitations x2 sets    Other Standing Lumbar Exercises wall arch 15 reps x 2 sets     Lumbar Exercises: Seated   Sit to Stand 15 reps   Sit to Stand Limitations no UE's     Lumbar Exercises: Supine   Bent Knee Raise 15 reps   Bent Knee Raise Limitations with ab set    Bridge 15 reps   Bridge Limitations 2 sets   Straight Leg Raise 15 reps   Straight Leg Raises Limitations 2 sets   Large Ball Abdominal Isometric 15 reps   Large Ball Abdominal Isometric Limitations green   Large Ball Oblique Isometric 15 reps   Large Ball Oblique Isometric Limitations green     Lumbar Exercises: Sidelying   Hip Abduction 15 reps   Hip Abduction Weights (lbs) 2 sets     Lumbar Exercises: Prone   Straight Leg Raise 15 reps   Straight Leg Raises Limitations 2 sets   Other Prone Lumbar Exercises PRONE ON ELBOWS 2 MINUTES   Other Prone Lumbar Exercises heelsqueezes 20reps                  PT Short Term Goals - 06/04/16 1708      PT SHORT TERM GOAL #1  Title Pt will demo consistency and independence with her HEP to improve overall pain and LE strength.    Time 2   Period Weeks   Status New     PT SHORT TERM GOAL #2   Title Pt will demo correct set up and use of lumbar roll to improve her sitting tolerance.    Time 2   Period Weeks   Status New     PT SHORT TERM GOAL #3   Title Pt will demo correct log roll technique when transitioning in/out of bed and the mat table to decrease strain on her back musculature.    Time 3   Period Weeks   Status New           PT Long Term Goals - 06/04/16 1709      PT LONG TERM GOAL #1   Title Pt will demo improved BLE strength to atleast 4/5 MMT to improve her safety with stair negotiation.   Time 6   Status New     PT LONG TERM GOAL #2   Title pt will demo improved postural awareness evident by her ability to maintain upright posture atleast 50% of the session without cues from the therapist.    Time 6   Period Weeks   Status New      PT LONG TERM GOAL #3   Title Pt will perform 5x sit to stand in less than 17sec, to demonstrate an improvemetn in functional strength.    Time 6   Period Weeks   Status New     PT LONG TERM GOAL #4   Title Pt will demo proper lifting mechanics evident by her ability to lift a small box off the floor without cues from therapist for correct technique, x5 trials.   Time 6   Period Weeks   Status New     PT LONG TERM GOAL #5   Title Pt will report no more than 4/10 pain during her daily activity, to allow her to perform housework without signfiicant pain/discomfort.    Time 6   Period Weeks   Status New               Plan - 07/15/16 1629    Clinical Impression Statement Continued with core stab and LE strengthening focus.  Accompanied today by daughter and interpreter Albertina Senegal).  Progressed with addition of standing lunges, prone heel squeezes and abdominal ball exercises.  Pt able to complete all these without c/o pain or pain behaviors.  Pt jovial/happy during entire session.  Able to increase to 2 sets of most exercises today as well.  cues needed for form, completing exercises slowly and increased hold times.   Rehab Potential Good   PT Frequency 2x / week   PT Duration 6 weeks   PT Treatment/Interventions ADLs/Self Care Home Management;Moist Heat;Aquatic Therapy;Therapeutic exercise;Therapeutic activities;Functional mobility training;Stair training;Gait training;Balance training;Neuromuscular re-education;Patient/family education;Manual techniques;Passive range of motion   PT Next Visit Plan general trunk and LE strengthening; lifting mechanics and postural awareness/strengthening.  Begin postural strengthening next session.      Patient will benefit from skilled therapeutic intervention in order to improve the following deficits and impairments:  Abnormal gait, Decreased activity tolerance, Decreased strength, Impaired flexibility, Postural dysfunction, Pain, Improper  body mechanics, Decreased range of motion, Increased muscle spasms  Visit Diagnosis: Chronic low back pain, unspecified back pain laterality, with sciatica presence unspecified  Muscle weakness (generalized)  Other abnormalities of gait and mobility  Problem List Patient Active Problem List   Diagnosis Date Noted  . Relapsing remitting multiple sclerosis (HCC) 10/19/2015  . Nexplanon insertion 05/15/2015  . Multiple sclerosis (HCC) 06/19/2011    Lurena Nida, PTA/CLT [phone]  07/15/2016, 4:42 PM  [facility] [address] [place], [place], [address] Phone: [phone]   Fax:  [phone]  Name: [name] MRN: [id number] Date of Birth: 01-16-1971

## 2016-07-17 ENCOUNTER — Ambulatory Visit (HOSPITAL_COMMUNITY): Payer: BLUE CROSS/BLUE SHIELD | Admitting: Physical Therapy

## 2016-07-17 DIAGNOSIS — G8929 Other chronic pain: Secondary | ICD-10-CM

## 2016-07-17 DIAGNOSIS — M545 Low back pain: Principal | ICD-10-CM

## 2016-07-17 DIAGNOSIS — R2689 Other abnormalities of gait and mobility: Secondary | ICD-10-CM

## 2016-07-17 DIAGNOSIS — M6281 Muscle weakness (generalized): Secondary | ICD-10-CM

## 2016-07-18 NOTE — Therapy (Signed)
[facility] [address] [place], [place], [address] Phone: [phone]   Fax:  [phone]  Physical Therapy Treatment/Discharge   Patient Details  Name: [name] MRN: [id number] Date of Birth: 1970/11/08 Referring Provider: Rachell Cipro  Encounter Date: 07/17/2016      PT End of Session - 07/18/16 0809    Visit Number 6   Number of Visits 12   Authorization Type BCBS   Authorization Time Period 06/04/16 to 07/23/16   PT Start Time 2542   PT Stop Time 1730   PT Time Calculation (min) 40 min   Activity Tolerance Patient tolerated treatment well;No increased pain   Behavior During Therapy WFL for tasks assessed/performed      Past Medical History:  Diagnosis Date  . Headache   . Multiple sclerosis ([place]) 06/19/2011  . Multiple sclerosis ([place]) 06/19/2011    No past surgical history on file.  There were no vitals filed for this visit.      Subjective Assessment - 07/17/16 1653    Subjective Pt states that things are going good. She was busy bringing things from one room to the other and noticed that her back and legs got tired and achy. She tried to massage her back with a tool she has and this helped some. She feels that overall her pain has decreased since beginning PT and she continues to perform her HEP daily.    Patient is accompained by: Interpreter  Anastasio Auerbach   Currently in Pain? Yes  unsure    Pain Orientation Mid;Lower   Pain Descriptors / Indicators Aching   Pain Type Chronic pain   Pain Radiating Towards B legs "spots of pain"   Pain Onset More than a month ago   Pain Frequency Intermittent   Aggravating Factors  up and moving around too much. laying on her back too long    Pain Relieving Factors unsure             [facility] PT Assessment - 07/18/16 0001      Assessment   Medical Diagnosis Rt leg pain   Referring Provider Rachell Cipro   Onset Date/Surgical Date --  back ~3 years  ago, Leg pain ~2-3 weeks ago   Next MD Visit none    Prior Therapy None      Precautions   Precautions None     Restrictions   Weight Bearing Restrictions No     [place] residence     Prior Function   Level of Independence Independent   Vocation Unemployed   Leisure taking care of 3 grandchildren       Cognition   Overall Cognitive Status Within Functional Limits for tasks assessed     Observation/Other Assessments   Observations forward head, rounded shoulders     Sensation   Light Touch Appears Intact     AROM   Lumbar Flexion WNL, stretch at end range  increased back pain with repeated flexion x10   Lumbar Extension WNL, pain with end range     Strength   Right Hip Flexion 4+/5   Right Hip Extension 4-/5   Right Hip ABduction 4+/5   Left Hip Flexion 4+/5   Left Hip Extension 4-/5   Left Hip ABduction 4+/5   Right Knee Flexion 5/5   Right Knee Extension 5/5   Left Knee Flexion 5/5   Left Knee Extension 5/5   Right Ankle Dorsiflexion 5/5  Left Ankle Dorsiflexion 5/5     Palpation   Palpation comment TTP B lumbar paraspinals                     [facility] Adult PT Treatment/Exercise - 07/18/16 0001      Transfers   Five time sit to stand comments  16 sec, no UE     Posture/Postural Control   Posture/Postural Control Postural limitations   Postural Limitations Rounded Shoulders;Forward head;Decreased lumbar lordosis;Increased thoracic kyphosis;Weight shift left     Exercises   Exercises Other Exercises   Other Exercises  hip flexor stretch in thomas position 3x20 sec each      Lumbar Exercises: Stretches   Double Knee to Chest Stretch 4 reps;20 seconds     Lumbar Exercises: Supine   Other Supine Lumbar Exercises double knee to chest 3x20 sec hold    Other Supine Lumbar Exercises low trunk rotation x10 reps      Lumbar Exercises: Prone   Straight Leg Raise 10 reps                PT  Education - 07/18/16 0803    Education provided Yes   Education Details discussed progress and areas noted deficit that should continue to improve with HEP adherence; discussed unique nature of her LE symptoms and inability for therapist to reporoduce them. Discussed possibility of the "spotted pain" being related to her MS rather than coming directly from her back; HEP updated and reviewed    Person(s) Educated Patient   Methods Explanation;Demonstration;Handout   Comprehension Verbalized understanding;Returned demonstration          PT Short Term Goals - 07/18/16 1047      PT SHORT TERM GOAL #1   Title Pt will demo consistency and independence with her HEP to improve overall pain and LE strength.    Time 2   Period Weeks   Status Achieved     PT SHORT TERM GOAL #2   Title Pt will demo correct set up and use of lumbar roll to improve her sitting tolerance.    Time 2   Period Weeks   Status Partially Met     PT SHORT TERM GOAL #3   Title Pt will demo correct log roll technique when transitioning in/out of bed and the mat table to decrease strain on her back musculature.    Time 3   Period Weeks   Status Partially Met           PT Long Term Goals - 07/18/16 1047      PT LONG TERM GOAL #1   Title Pt will demo improved BLE strength to atleast 4/5 MMT to improve her safety with stair negotiation.   Time 6   Status Achieved     PT LONG TERM GOAL #2   Title pt will demo improved postural awareness evident by her ability to maintain upright posture atleast 50% of the session without cues from the therapist.    Time 6   Period Weeks   Status Partially Met     PT LONG TERM GOAL #3   Title Pt will perform 5x sit to stand in less than 17sec, to demonstrate an improvement in functional strength.    Time 6   Period Weeks   Status Achieved     PT LONG TERM GOAL #4   Title Pt will demo proper lifting mechanics evident by her ability to lift a small box off the  floor without  cues from therapist for correct technique, x5 trials.   Time 6   Period Weeks   Status Partially Met     PT LONG TERM GOAL #5   Title Pt will report no more than 4/10 pain during her daily activity, to allow her to perform housework without signfiicant pain/discomfort.    Time 6   Period Weeks   Status Partially Met               Plan - 07/18/16 6967    Clinical Impression Statement Pt has made fair progress since beginning PT meeting several of her goals and demonstrating improvements in hip strength and activity tolerance. Pts verbal pain report does not remain consistent with her performance and physical response to activities during her sessions. She does present with mild achiness in her mid-low back which has decreased in intensity since beginning PT. Her LE symptoms have been inconsistent and the therapist has been unable to reproduce these during her sessions other than muscle fatigue from strengthening. At this time, it is unclear whether her LE symptoms are stemming from her back or from her multiple sclerosis. She is being discharged from PT at this time with improvements made in all areas of strength, ROM and mobility and with an updated HEP to promote continued hip strength, and lumbar mobility. She verbalized agreement with this.    Rehab Potential Good   PT Frequency 2x / week   PT Duration 6 weeks   PT Treatment/Interventions ADLs/Self Care Home Management;Moist Heat;Aquatic Therapy;Therapeutic exercise;Therapeutic activities;Functional mobility training;Stair training;Gait training;Balance training;Neuromuscular re-education;Patient/family education;Manual techniques;Passive range of motion   PT Next Visit Plan d/c this visit    PT Home Exercise Plan bridge, prone hip extension, supine low trunk rotation, supine DKTC, supine hip flexor stretch   Recommended Other Services none    Consulted and Agree with Plan of Care Patient      Patient will benefit from skilled  therapeutic intervention in order to improve the following deficits and impairments:  Abnormal gait, Decreased activity tolerance, Decreased strength, Impaired flexibility, Postural dysfunction, Pain, Improper body mechanics, Decreased range of motion, Increased muscle spasms  Visit Diagnosis: Chronic low back pain, unspecified back pain laterality, with sciatica presence unspecified  Muscle weakness (generalized)  Other abnormalities of gait and mobility     Problem List Patient Active Problem List   Diagnosis Date Noted  . Relapsing remitting multiple sclerosis ([place]) 10/19/2015  . Nexplanon insertion 05/15/2015  . Multiple sclerosis ([place]) 06/19/2011    PHYSICAL THERAPY DISCHARGE SUMMARY  Visits from Start of Care: 6  Current functional level related to goals / functional outcomes: Improved hip strength atleast 4/5, improved lumbar ROM with decreased pain reported, overall decrease in intensity of pain   Remaining deficits: Pt continues to present with poor postural awareness despite therapist education at evaluation and throughout POC. Also unsure of pt's performance of HEP to address muscle spasm/pain during activity.    Education / Equipment: discussed progress and areas noted deficit that should continue to improve with HEP adherence; discussed unique nature of her LE symptoms and inability for therapist to reporoduce them. Discussed possibility of the "spotted pain" being related to her MS rather than coming directly from her back; HEP updated and reviewed and pt was encouraged to perform these exercises during the day when noticing increased muscle pain. Plan: Patient agrees to discharge.  Patient goals were partially met. Patient is being discharged due to being pleased with the current functional  level.  ?????         10:51 AM,07/18/16 Elly Modena PT, DPT [facility] [place] [address] [place], [place], [address] Phone: [phone]   Fax:  [phone]  Name: Delynn Olvera MRN: [id number] Date of Birth: 04/21/1971

## 2016-07-22 ENCOUNTER — Ambulatory Visit (HOSPITAL_COMMUNITY): Payer: BLUE CROSS/BLUE SHIELD | Admitting: Physical Therapy

## 2016-09-08 ENCOUNTER — Encounter (HOSPITAL_COMMUNITY): Payer: Self-pay | Admitting: Emergency Medicine

## 2016-09-08 ENCOUNTER — Emergency Department (HOSPITAL_COMMUNITY): Payer: BLUE CROSS/BLUE SHIELD

## 2016-09-08 ENCOUNTER — Inpatient Hospital Stay (HOSPITAL_COMMUNITY)
Admission: EM | Admit: 2016-09-08 | Discharge: 2016-09-11 | DRG: 060 | Disposition: A | Discharge status: Home Health | Payer: BLUE CROSS/BLUE SHIELD | Attending: Family Medicine | Admitting: Family Medicine

## 2016-09-08 DIAGNOSIS — R1310 Dysphagia, unspecified: Secondary | ICD-10-CM | POA: Diagnosis present

## 2016-09-08 DIAGNOSIS — M5136 Other intervertebral disc degeneration, lumbar region: Secondary | ICD-10-CM | POA: Diagnosis not present

## 2016-09-08 DIAGNOSIS — R51 Headache: Secondary | ICD-10-CM | POA: Diagnosis not present

## 2016-09-08 DIAGNOSIS — T380X5A Adverse effect of glucocorticoids and synthetic analogues, initial encounter: Secondary | ICD-10-CM | POA: Diagnosis not present

## 2016-09-08 DIAGNOSIS — R200 Anesthesia of skin: Secondary | ICD-10-CM | POA: Diagnosis present

## 2016-09-08 DIAGNOSIS — G35 Multiple sclerosis: Principal | ICD-10-CM | POA: Diagnosis present

## 2016-09-08 DIAGNOSIS — G8190 Hemiplegia, unspecified affecting unspecified side: Secondary | ICD-10-CM

## 2016-09-08 DIAGNOSIS — R739 Hyperglycemia, unspecified: Secondary | ICD-10-CM

## 2016-09-08 DIAGNOSIS — Z7982 Long term (current) use of aspirin: Secondary | ICD-10-CM | POA: Diagnosis not present

## 2016-09-08 DIAGNOSIS — R262 Difficulty in walking, not elsewhere classified: Secondary | ICD-10-CM | POA: Diagnosis not present

## 2016-09-08 DIAGNOSIS — R29810 Facial weakness: Secondary | ICD-10-CM | POA: Diagnosis present

## 2016-09-08 DIAGNOSIS — Z8249 Family history of ischemic heart disease and other diseases of the circulatory system: Secondary | ICD-10-CM | POA: Diagnosis not present

## 2016-09-08 DIAGNOSIS — R531 Weakness: Secondary | ICD-10-CM | POA: Diagnosis not present

## 2016-09-08 DIAGNOSIS — G8194 Hemiplegia, unspecified affecting left nondominant side: Secondary | ICD-10-CM | POA: Diagnosis not present

## 2016-09-08 DIAGNOSIS — R0789 Other chest pain: Secondary | ICD-10-CM

## 2016-09-08 DIAGNOSIS — R519 Headache, unspecified: Secondary | ICD-10-CM | POA: Diagnosis present

## 2016-09-08 DIAGNOSIS — R29898 Other symptoms and signs involving the musculoskeletal system: Secondary | ICD-10-CM

## 2016-09-08 LAB — COMPREHENSIVE METABOLIC PANEL
ALT: 15 U/L (ref 14–54)
AST: 22 U/L (ref 15–41)
Albumin: 3.7 g/dL (ref 3.5–5.0)
Alkaline Phosphatase: 75 U/L (ref 38–126)
Anion gap: 7 (ref 5–15)
BUN: 18 mg/dL (ref 6–20)
CO2: 23 mmol/L (ref 22–32)
Calcium: 9 mg/dL (ref 8.9–10.3)
Chloride: 108 mmol/L (ref 101–111)
Creatinine, Ser: 0.56 mg/dL (ref 0.44–1.00)
GFR calc Af Amer: >60 mL/min (ref >60)
GFR calc non Af Amer: >60 mL/min (ref >60)
Glucose, Bld: 120 mg/dL — ABNORMAL HIGH (ref 65–99)
Potassium: 3.4 mmol/L — ABNORMAL LOW (ref 3.5–5.1)
Sodium: 138 mmol/L (ref 135–145)
Total Bilirubin: 0.2 mg/dL — ABNORMAL LOW (ref 0.3–1.2)
Total Protein: 6.9 g/dL (ref 6.5–8.1)

## 2016-09-08 LAB — DIFFERENTIAL
Basophils Absolute: 0.1 10*3/uL (ref 0.0–0.1)
Basophils Relative: 1 %
Eosinophils Absolute: 0.2 10*3/uL (ref 0.0–0.7)
Eosinophils Relative: 3 %
Lymphocytes Relative: 32 %
Lymphs Abs: 2.2 10*3/uL (ref 0.7–4.0)
Monocytes Absolute: 0.6 10*3/uL (ref 0.1–1.0)
Monocytes Relative: 9 %
Neutro Abs: 3.9 10*3/uL (ref 1.7–7.7)
Neutrophils Relative %: 55 %

## 2016-09-08 LAB — CBC
HCT: 41.3 % (ref 36.0–46.0)
Hemoglobin: 14.2 g/dL (ref 12.0–15.0)
MCH: 30.7 pg (ref 26.0–34.0)
MCHC: 34.4 g/dL (ref 30.0–36.0)
MCV: 89.4 fL (ref 78.0–100.0)
Platelets: 330 10*3/uL (ref 150–400)
RBC: 4.62 MIL/uL (ref 3.87–5.11)
RDW: 13.7 % (ref 11.5–15.5)
WBC: 7 10*3/uL (ref 4.0–10.5)

## 2016-09-08 LAB — HCG, QUANTITATIVE, PREGNANCY: hCG, Beta Chain, Quant, S: <1 m[IU]/mL (ref <5)

## 2016-09-08 LAB — PROTIME-INR
INR: 0.9
Prothrombin Time: 12.2 seconds (ref 11.4–15.2)

## 2016-09-08 LAB — APTT: aPTT: 30 seconds (ref 24–36)

## 2016-09-08 MED ORDER — METHYLPREDNISOLONE SODIUM SUCC 1000 MG IJ SOLR
INTRAMUSCULAR | Status: AC
  Filled 2016-09-08: qty 8

## 2016-09-08 MED ORDER — FAMOTIDINE IN NACL 20-0.9 MG/50ML-% IV SOLN
20.0000 mg | Freq: Two times a day (BID) | INTRAVENOUS | Status: DC
  Administered 2016-09-09 – 2016-09-11 (×5): 20 mg via INTRAVENOUS
  Filled 2016-09-08 (×5): qty 50

## 2016-09-08 MED ORDER — DIMETHYL FUMARATE 240 MG PO CPDR
240.0000 mg | DELAYED_RELEASE_CAPSULE | Freq: Every day | ORAL | Status: DC
Start: 2016-09-09 — End: 2016-09-11
  Administered 2016-09-10 – 2016-09-11 (×2): 240 mg via ORAL

## 2016-09-08 MED ORDER — DEXTROSE-NACL 5-0.9 % IV SOLN
INTRAVENOUS | Status: DC
  Administered 2016-09-09: via INTRAVENOUS

## 2016-09-08 MED ORDER — INSULIN ASPART 100 UNIT/ML ~~LOC~~ SOLN
0.0000 [IU] | SUBCUTANEOUS | Status: DC
  Administered 2016-09-09 (×3): 2 [IU] via SUBCUTANEOUS
  Administered 2016-09-10: 3 [IU] via SUBCUTANEOUS
  Administered 2016-09-10 (×2): 2 [IU] via SUBCUTANEOUS
  Administered 2016-09-10: 3 [IU] via SUBCUTANEOUS

## 2016-09-08 MED ORDER — ASPIRIN EC 325 MG PO TBEC: 325.0000 mg | DELAYED_RELEASE_TABLET | Freq: Every day | ORAL | Status: DC | PRN

## 2016-09-08 MED ORDER — METHYLPREDNISOLONE SODIUM SUCC 125 MG IJ SOLR: 1000.0000 mg | Freq: Once | INTRAMUSCULAR | Status: DC

## 2016-09-08 MED ORDER — SODIUM CHLORIDE 0.9% FLUSH
3.0000 mL | Freq: Two times a day (BID) | INTRAVENOUS | Status: DC
  Administered 2016-09-09 – 2016-09-11 (×5): 3 mL via INTRAVENOUS

## 2016-09-08 MED ORDER — SODIUM CHLORIDE 0.9 % IV SOLN
1000.0000 mg | Freq: Once | INTRAVENOUS | Status: AC
  Administered 2016-09-08: 1000 mg via INTRAVENOUS
  Filled 2016-09-08: qty 8

## 2016-09-08 MED ORDER — SODIUM CHLORIDE 0.9 % IV SOLN
1000.0000 mg | Freq: Every day | INTRAVENOUS | Status: AC
  Administered 2016-09-09 – 2016-09-10 (×2): 1000 mg via INTRAVENOUS
  Filled 2016-09-08 (×2): qty 8

## 2016-09-08 MED ORDER — HEPARIN SODIUM (PORCINE) 5000 UNIT/ML IJ SOLN
5000.0000 [IU] | Freq: Three times a day (TID) | INTRAMUSCULAR | Status: DC
  Administered 2016-09-09 – 2016-09-11 (×6): 5000 [IU] via SUBCUTANEOUS
  Filled 2016-09-08 (×6): qty 1

## 2016-09-08 NOTE — ED Notes (Addendum)
Family states mother not talking to them as she did when they came in & now her tongue is sticking out & unable to pull back in her mouth. . EDP notified.

## 2016-09-08 NOTE — H&P (Signed)
History and Physical    [name] JJK:[id number] DOB: 21-Dec-1970 DOA: 09/08/2016  PCP: Maryelizabeth Rowan, MD  Patient coming from: Home.    Chief Complaint:   Left sided weakness, leg pain, and left facial droop.   HPI: [name] is an 46 y.o. female with hx of relapsing remitting Multiple Sclerosis, dx several years ago, under previous neurologist Dr Gerilyn Pilgrim, but is now seeing a neurologist in [place] due to insurance non coverage, hx of HA, Depression, but no other psychiatric diagnosis, on Ticfidera, presented to the ER with several days of increased left sided weakness, facial droop, bilateral leg pain and left shoulder pain.  She has some chest pain, but no SOB, nausea or vomiting.  She denied fever, chills, nausea, vomiting or stiff neck.  She said her tongue was stiff.  Work up included a negative head CT for any acute process, and serology was unremarkable.  She only speaks spanish, but her son and daughter is fluent in [place] and served as Nurse, learning disability.  Official translator service was not available. EDP spoke with neurologist Dr Gerilyn Pilgrim, who recommended high dose IV Steroids.  Hospitalist was asked to admit her for further Tx.   ED Course:  See above.  Rewiew of Systems:  Constitutional: Negative for malaise, fever and chills. No significant weight loss or weight gain Eyes: Negative for eye pain, redness and discharge, diplopia, visual changes, or flashes of light. ENMT: Negative for ear pain, hoarseness, nasal congestion, sinus pressure and sore throat. No headaches; tinnitus, drooling, or problem swallowing. Cardiovascular: Negative for chest pain, palpitations, diaphoresis, dyspnea and peripheral edema. ; No orthopnea, PND Respiratory: Negative for cough, hemoptysis, wheezing and stridor. No pleuritic chestpain. Gastrointestinal: Negative for diarrhea, constipation,  melena, blood in stool, hematemesis, jaundice and rectal bleeding.    Genitourinary:  Negative for frequency, dysuria, incontinence,flank pain and hematuria; Musculoskeletal: Negative for back pain and neck pain. Negative for swelling and trauma.;  Skin: . Negative for pruritus, rash, abrasions, bruising and skin lesion.; ulcerations Neuro: Negative for headache, lightheadedness and neck stiffness. Negative for weakness, altered level of consciousness , altered mental status, extremity weakness, burning feet, involuntary movement, seizure and syncope.  Psych: negative for anxiety, depression, insomnia, tearfulness, panic attacks, hallucinations, paranoia, suicidal or homicidal ideation   Past Medical History:  Diagnosis Date  . Headache   . Multiple sclerosis (HCC) 06/19/2011  . Multiple sclerosis (HCC) 06/19/2011    History reviewed. No pertinent surgical history.   reports that she has never smoked. She has never used smokeless tobacco. She reports that she does not drink alcohol or use drugs.  No Known Allergies  Family History  Problem Relation Age of Onset  . Hypertension Mother      Prior to Admission medications   Medication Sig Start Date End Date Taking? Authorizing Provider  aspirin EC 325 MG tablet Take 325-650 mg by mouth daily as needed for mild pain.   Yes Historical Provider, MD  Dimethyl Fumarate (TECFIDERA) 240 MG CPDR Take 240 mg by mouth daily.    Yes Historical Provider, MD  etonogestrel (NEXPLANON) 68 MG IMPL implant 1 each by Subdermal route once.   Yes Historical Provider, MD    Physical Exam: Vitals:   09/08/16 1945 09/08/16 2000 09/08/16 2030 09/08/16 2130  BP:  119/80 136/87 118/80  Pulse: 88 86 88 91  Resp: 23 17 22 21   Temp:      TempSrc:      SpO2: 98% 99% 99% 99%  Weight:      Height:          Constitutional: NAD, calm, comfortable Vitals:   09/08/16 1945 09/08/16 2000 09/08/16 2030 09/08/16 2130  BP:  119/80 136/87 118/80  Pulse: 88 86 88 91  Resp: 23 17 22 21   Temp:      TempSrc:      SpO2: 98% 99% 99% 99%    Weight:      Height:       Eyes: PERRL, lids and conjunctivae normal ENMT: Mucous membranes are moist. Posterior pharynx clear of any exudate or lesions.Normal dentition.  Neck: normal, supple, no masses, no thyromegaly Respiratory: clear to auscultation bilaterally, no wheezing, no crackles. Normal respiratory effort. No accessory muscle use.  Cardiovascular: Regular rate and rhythm, no murmurs / rubs / gallops. No extremity edema. 2+ pedal pulses. No carotid bruits.  Abdomen: no tenderness, no masses palpated. No hepatosplenomegaly. Bowel sounds positive.  Musculoskeletal: no clubbing / cyanosis. No joint deformity upper and lower extremities. Good ROM, no contractures. Normal muscle tone.  Skin: no rashes, lesions, ulcers. No induration Neurologic: left sided weakness.  Tongue deviated to the left.  Slight left facial droop.  Converse normally with her daughter at bedside.  Psychiatric: Normal judgment and insight. Alert and oriented x 3. Normal mood.     Labs on Admission: I have personally reviewed following labs and imaging studies  CBC:  Recent Labs Lab 09/08/16 1900  WBC 7.0  NEUTROABS 3.9  HGB 14.2  HCT 41.3  MCV 89.4  PLT 330   Basic Metabolic Panel:  Recent Labs Lab 09/08/16 1935  NA 138  K 3.4*  CL 108  CO2 23  GLUCOSE 120*  BUN 18  CREATININE 0.56  CALCIUM 9.0   GFR: Estimated Creatinine Clearance: 85.4 mL/min (by C-G formula based on SCr of 0.56 mg/dL). Liver Function Tests:  Recent Labs Lab 09/08/16 1935  AST 22  ALT 15  ALKPHOS 75  BILITOT 0.2*  PROT 6.9  ALBUMIN 3.7   Coagulation Profile:  Recent Labs Lab 09/08/16 1933  INR 0.90    Urine analysis:    Component Value Date/Time   COLORURINE YELLOW 06/20/2015 1050   APPEARANCEUR CLEAR 06/20/2015 1050   LABSPEC 1.015 06/20/2015 1050   PHURINE 6.0 06/20/2015 1050   GLUCOSEU NEGATIVE 06/20/2015 1050   HGBUR MODERATE (A) 06/20/2015 1050   BILIRUBINUR NEGATIVE 06/20/2015 1050    KETONESUR NEGATIVE 06/20/2015 1050   PROTEINUR trace 09/28/2015 1108   PROTEINUR NEGATIVE 09/22/2007 1113   UROBILINOGEN 0.2 06/20/2015 1050   NITRITE neg 09/28/2015 1108   NITRITE NEGATIVE 06/20/2015 1050   LEUKOCYTESUR Negative 09/28/2015 1108    Radiological Exams on Admission: Ct Head Wo Contrast  Result Date: 09/08/2016 CLINICAL DATA:  Left-sided weakness EXAM: CT HEAD WITHOUT CONTRAST TECHNIQUE: Contiguous axial images were obtained from the base of the skull through the vertex without intravenous contrast. COMPARISON:  Brain MRI 07/01/2015 FINDINGS: Brain: No evidence of acute infarction, hemorrhage, hydrocephalus, extra-axial collection or mass effect. White matter disease that is underestimated relative to MRI in this patient with history of multiple sclerosis. 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 Vascular: Atherosclerotic calcifications seen at the left vertebral. No hyperdense vessel. Skull: Negative Sinuses/Orbits: No acute finding IMPRESSION: 1. No acute finding. 2. 3 mm colloid cyst.  No hydrocephalus. Electronically Signed   By: Marnee Spring M.D.   On: 09/08/2016 19:43     EKG: Independently reviewed.   Assessment/Plan  Principal Problem:   Relapsing remitting multiple sclerosis (HCC) Active Problems:   Multiple sclerosis (HCC)   Frequent headaches   Multiple sclerosis exacerbation (HCC)    PLAN:   Multiple Sclerosis with exacerbation:  Will continue with IV steroids x 3 days.  Differential here also included complex migraine and conversion disorder (not malingering, in my opinion).  Will obtain MRI with and without contrast of her brain as well.  Continue with Ticfedera if she can swallow.  Made NPO and obtain speech evaluation.  Give PPI as she is also on ASA.   HA:  Chronic.  R/out migraine with complicated features.   Hyperglycemia:  Will follow CBG.  SSI as she can develop steroid induced DM.     DVT prophylaxis: Sub Q heparin.    Code Status: FULL CODE.  Family Communication: daughter and son at bedside.  Disposition Plan: Home when appropriate.  Consults called: Neurology Dr Gerilyn Pilgrim.  Admission status: Inpatient as she will likely need 3 days of IV high dose steroids.    Reylynn Vanalstine MD FACP. [facility]  If 7PM-7AM, please contact night-coverage [email or web address] Password [email or web address]  09/08/2016, 10:17 PM

## 2016-09-08 NOTE — ED Triage Notes (Signed)
PT states she woke up with a headache this am at 0800. PT states her face became numb around 0900 and then noticed left sided facial paralysis and left arm weakness at 1600. PT also has MS.

## 2016-09-08 NOTE — ED Provider Notes (Signed)
[facility]-EMER[facility]ENCY DEPT Provider Note   CSN: [id number] Arrival date & time: 09/08/16  1849     History   Chief Complaint Chief Complaint  Patient presents with  . Code Stroke    HPI [name] is a 46 y.o. female.  HPI  She is spanish speaking. History provided by family.  46 year old female with history of relapsing and remitting multiple sclerosis on Tecfidera who presents with left-sided numbness and weakness. Reports being in her usual state of health until she woke up this morning at 8 AM with left-sided headache. States around 9 AM her left face became numb. Progressively throughout the day noticed left-sided facial droop, left arm and left leg weakness, left arm and left leg numbness. She denies any recent illnesses including fever, cough, vomiting, diarrhea.   Past Medical History:  Diagnosis Date  . Headache   . Multiple sclerosis (HCC) 06/19/2011  . Multiple sclerosis (HCC) 06/19/2011    Patient Active Problem List   Diagnosis Date Noted  . Relapsing remitting multiple sclerosis (HCC) 10/19/2015  . Nexplanon insertion 05/15/2015  . Multiple sclerosis (HCC) 06/19/2011    History reviewed. No pertinent surgical history.  OB History    Gravida Para Term Preterm AB Living   6         6   SAB TAB Ectopic Multiple Live Births                   Home Medications    Prior to Admission medications   Medication Sig Start Date End Date Taking? Authorizing Provider  aspirin EC 325 MG tablet Take 325-650 mg by mouth daily as needed for mild pain.   Yes Historical Provider, MD  Dimethyl Fumarate (TECFIDERA) 240 MG CPDR Take 240 mg by mouth daily.    Yes Historical Provider, MD  etonogestrel (NEXPLANON) 68 MG IMPL implant 1 each by Subdermal route once.   Yes Historical Provider, MD    Family History Family History  Problem Relation Age of Onset  . Hypertension Mother     Social History Social History  Substance Use Topics  . Smoking status:  Never Smoker  . Smokeless tobacco: Never Used  . Alcohol use No     Allergies   Patient has no known allergies.   Review of Systems Review of Systems 10/14 systems reviewed and are negative other than those stated in the HPI   Physical Exam Updated Vital Signs BP 118/80   Pulse 91   Temp 98.4 F (36.9 C) (Oral)   Resp 21   Ht 5\' 2"  (1.575 m)   Wt 170 lb (77.1 kg)   SpO2 99%   BMI 31.09 kg/m   Physical Exam Physical Exam  Nursing note and vitals reviewed. Constitutional:  non-toxic, and in no acute distress Head: Normocephalic and atraumatic.  Mouth/Throat: Oropharynx is clear and moist.  Neck: Normal range of motion. Neck supple.  Cardiovascular: Normal rate and regular rhythm.   Pulmonary/Chest: Effort normal and breath sounds normal.  Abdominal: Soft. There is no tenderness. There is no rebound and no guarding.  Musculoskeletal: Normal range of motion.  Skin: Skin is warm and dry.  Psychiatric: Cooperative Neurological:  Alert, oriented to person, place, time, and situation. Memory grossly in tact. Fluent speech. No dysarthria or aphasia.  Cranial nerves:  Pupils are symmetric, and reactive to light. EOMI without nystagmus. No gaze deviation. No appreciable facial droop, but not opening mouth consistently or smiling on command. Sensation to light touch  over face diminished over left face. Hearing grossly in tact. Palate elevates symmetrically. Head turn and shoulder shrug are intact. Tongue midline.  Reflexes defered.  Muscle bulk and tone normal. Unable to lift LUE/LLE against gravity, but will spontaneously move left arm to mild extent Sensation to light touch is in diminished over LLE and LUE.  Unable to evaluate gait or for dysmetria due to lack of cooperation    ED Treatments / Results  Labs (all labs ordered are listed, but only abnormal results are displayed) Labs Reviewed  COMPREHENSIVE METABOLIC PANEL - Abnormal; Notable for the following:        Result Value   Potassium 3.4 (*)    Glucose, Bld 120 (*)    Total Bilirubin 0.2 (*)    All other components within normal limits  CBC  DIFFERENTIAL  PROTIME-INR  APTT  HCG, QUANTITATIVE, PREGNANCY  URINALYSIS, ROUTINE W REFLEX MICROSCOPIC    EKG  EKG Interpretation  Date/Time:  Sunday September 08 2016 18:56:57 EST Ventricular Rate:  99 PR Interval:    QRS Duration: 113 QT Interval:  379 QTC Calculation: 487 R Axis:   -27 Text Interpretation:  Sinus rhythm Borderline intraventricular conduction delay Low voltage, precordial leads Borderline prolonged QT interval Baseline wander in lead(s) V2 Confirmed by Aerilyn Slee MD, Maryam Feely ([id number]) on 09/08/2016 9:25:35 PM       Radiology Ct Head Wo Contrast  Result Date: 09/08/2016 CLINICAL DATA:  Left-sided weakness EXAM: CT HEAD WITHOUT CONTRAST TECHNIQUE: Contiguous axial images were obtained from the base of the skull through the vertex without intravenous contrast. COMPARISON:  Brain MRI 07/01/2015 FINDINGS: Brain: No evidence of acute infarction, hemorrhage, hydrocephalus, extra-axial collection or mass effect. White matter disease that is underestimated relative to MRI in this patient with history of multiple sclerosis. 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 Vascular: Atherosclerotic calcifications seen at the left vertebral. No hyperdense vessel. Skull: Negative Sinuses/Orbits: No acute finding IMPRESSION: 1. No acute finding. 2. 3 mm colloid cyst.  No hydrocephalus. Electronically Signed   By: Jonathon  Watts M.D.   On: 09/08/2016 19:43    Procedures Procedures (including critical care time)  Medications Ordered in ED Medications  methylPREDNISolone sodium succinate (SOLU-MEDROL) 1,000 mg in sodium chloride 0.9 % 50 mL IVPB (1,000 mg Intravenous Given 09/08/16 2151)     Initial Impression / Assessment and Plan / ED Course  I have reviewed the triage vital signs and the nursing notes.  Pertinent labs & imaging  results that were available during my care of the patient were reviewed by me and considered in my medical decision making (see chart for details).     45  year old female who presents with left sided numbness/weakness. Out of window for tpa for potential stroke, but presentation less concerning for stroke and more for questionable MS flare. Exam sometimes inconsistent in terms of degree of weakness. CT head visualized and normal. Blood work reassuring. UA pending.  Discussed with Dr. Gerilyn Pilgrim. Recommending treating her as potential MS flare with 1 g solumedrol. Will admit for MRI tomorrow. Discussed with Dr. Conley Rolls who will admit.   Final Clinical Impressions(s) / ED Diagnoses   Final diagnoses:  Multiple sclerosis (HCC)  Left-sided weakness  Left sided numbness    New Prescriptions New Prescriptions   No medications on file     Lavera Guise, MD 09/08/16 2156

## 2016-09-09 ENCOUNTER — Encounter (HOSPITAL_COMMUNITY): Payer: Self-pay

## 2016-09-09 ENCOUNTER — Inpatient Hospital Stay (HOSPITAL_COMMUNITY): Payer: BLUE CROSS/BLUE SHIELD

## 2016-09-09 DIAGNOSIS — R0789 Other chest pain: Secondary | ICD-10-CM

## 2016-09-09 DIAGNOSIS — R739 Hyperglycemia, unspecified: Secondary | ICD-10-CM

## 2016-09-09 DIAGNOSIS — R531 Weakness: Secondary | ICD-10-CM

## 2016-09-09 LAB — RAPID URINE DRUG SCREEN, HOSP PERFORMED
Amphetamines: NOT DETECTED
Barbiturates: NOT DETECTED
Benzodiazepines: NOT DETECTED
Cocaine: NOT DETECTED
Opiates: NOT DETECTED
Tetrahydrocannabinol: NOT DETECTED

## 2016-09-09 LAB — COMPREHENSIVE METABOLIC PANEL
ALT: 16 U/L (ref 14–54)
AST: 28 U/L (ref 15–41)
Albumin: 3.5 g/dL (ref 3.5–5.0)
Alkaline Phosphatase: 76 U/L (ref 38–126)
Anion gap: 11 (ref 5–15)
BUN: 15 mg/dL (ref 6–20)
CO2: 18 mmol/L — ABNORMAL LOW (ref 22–32)
Calcium: 8.9 mg/dL (ref 8.9–10.3)
Chloride: 108 mmol/L (ref 101–111)
Creatinine, Ser: 0.62 mg/dL (ref 0.44–1.00)
GFR calc Af Amer: >60 mL/min (ref >60)
GFR calc non Af Amer: >60 mL/min (ref >60)
Glucose, Bld: 202 mg/dL — ABNORMAL HIGH (ref 65–99)
Potassium: 4 mmol/L (ref 3.5–5.1)
Sodium: 137 mmol/L (ref 135–145)
Total Bilirubin: 0.3 mg/dL (ref 0.3–1.2)
Total Protein: 7.1 g/dL (ref 6.5–8.1)

## 2016-09-09 LAB — URINALYSIS, ROUTINE W REFLEX MICROSCOPIC
Bilirubin Urine: NEGATIVE
Glucose, UA: 250 mg/dL — AB
Hgb urine dipstick: NEGATIVE
Leukocytes, UA: NEGATIVE
Nitrite: NEGATIVE
Protein, ur: NEGATIVE mg/dL
Specific Gravity, Urine: >1.03 — ABNORMAL HIGH (ref 1.005–1.030)
pH: 5.5 (ref 5.0–8.0)

## 2016-09-09 LAB — GLUCOSE, CAPILLARY
Glucose-Capillary: 114 mg/dL — ABNORMAL HIGH (ref 65–99)
Glucose-Capillary: 119 mg/dL — ABNORMAL HIGH (ref 65–99)
Glucose-Capillary: 132 mg/dL — ABNORMAL HIGH (ref 65–99)
Glucose-Capillary: 142 mg/dL — ABNORMAL HIGH (ref 65–99)
Glucose-Capillary: 149 mg/dL — ABNORMAL HIGH (ref 65–99)
Glucose-Capillary: 178 mg/dL — ABNORMAL HIGH (ref 65–99)

## 2016-09-09 LAB — URINALYSIS, MICROSCOPIC (REFLEX)
RBC / HPF: NONE SEEN RBC/hpf (ref 0–5)
WBC, UA: NONE SEEN WBC/hpf (ref 0–5)

## 2016-09-09 LAB — TROPONIN I
Troponin I: <0.03 ng/mL (ref <0.03)
Troponin I: <0.03 ng/mL (ref <0.03)
Troponin I: <0.03 ng/mL (ref <0.03)

## 2016-09-09 LAB — CBC
HCT: 42.2 % (ref 36.0–46.0)
Hemoglobin: 14.3 g/dL (ref 12.0–15.0)
MCH: 30.4 pg (ref 26.0–34.0)
MCHC: 33.9 g/dL (ref 30.0–36.0)
MCV: 89.6 fL (ref 78.0–100.0)
Platelets: 347 10*3/uL (ref 150–400)
RBC: 4.71 MIL/uL (ref 3.87–5.11)
RDW: 13.5 % (ref 11.5–15.5)
WBC: 4.4 10*3/uL (ref 4.0–10.5)

## 2016-09-09 LAB — VITAMIN B12: Vitamin B-12: 398 pg/mL (ref 180–914)

## 2016-09-09 LAB — MAGNESIUM: Magnesium: 1.9 mg/dL (ref 1.7–2.4)

## 2016-09-09 LAB — TSH: TSH: 1.399 u[IU]/mL (ref 0.350–4.500)

## 2016-09-09 LAB — CK: Total CK: 52 U/L (ref 38–234)

## 2016-09-09 MED ORDER — TRAMADOL HCL 50 MG PO TABS
50.0000 mg | ORAL_TABLET | Freq: Four times a day (QID) | ORAL | Status: DC | PRN
  Filled 2016-09-09: qty 1

## 2016-09-09 MED ORDER — SODIUM CHLORIDE 0.9 % IV SOLN
INTRAVENOUS | Status: DC
  Administered 2016-09-09 (×2): via INTRAVENOUS

## 2016-09-09 MED ORDER — SODIUM CHLORIDE 0.9 % IV SOLN
INTRAVENOUS | Status: DC
  Filled 2016-09-09: qty 1000

## 2016-09-09 MED ORDER — MORPHINE SULFATE (PF) 2 MG/ML IV SOLN
2.0000 mg | INTRAVENOUS | Status: DC | PRN
  Administered 2016-09-09 – 2016-09-10 (×2): 2 mg via INTRAVENOUS
  Filled 2016-09-09 (×2): qty 1

## 2016-09-09 MED ORDER — KETOROLAC TROMETHAMINE 30 MG/ML IJ SOLN
30.0000 mg | Freq: Four times a day (QID) | INTRAMUSCULAR | Status: AC
  Administered 2016-09-09 – 2016-09-10 (×4): 30 mg via INTRAVENOUS
  Filled 2016-09-09 (×4): qty 1

## 2016-09-09 MED ORDER — GADOBENATE DIMEGLUMINE 529 MG/ML IV SOLN
20.0000 mL | Freq: Once | INTRAVENOUS | Status: AC | PRN
  Administered 2016-09-09: 16 mL via INTRAVENOUS

## 2016-09-09 NOTE — Progress Notes (Signed)
PROGRESS NOTE  [name] ZOX:[id number] DOB: 1971-06-02 DOA: 09/08/2016 PCP: Maryelizabeth Rowan, MD  Brief History:  46 year old female with a history of multiple sclerosis, chronic headache, and depression presented with acute onset of left facial numbness, left facial droop, and left upper extremity and lower extremity weakness that began around 9 AM on 09/08/2016 with symptom progression throughout the day. The patient woke up around 8 AM on 09/08/2016 with a headache and subsequently developed the above symptoms around 9 AM with progression throughout the day. The patient also complains of increasing lower back pain that began around the same time. She denies any bowel or bladder incontinence. She was able to ambulate, but had difficulty secondary to pain. She is able to bear weight on her legs. She denies any fevers, chills, nausea, vomiting, diarrhea, dysuria, hematuria. She does complain of chest discomfort that began 09/08/2016. without any shortness of breath, dizziness, nausea. There are no alleviating or exacerbating factors regarding her chest pain. She has been taking approximately 6-8 pills of Aleve on a weekly basis for her back pain and headaches. Neurology was contacted in the emergency department and recommended admission for possible multiple sclerosis exacerbation.  Assessment/Plan: Multiple sclerosis exacerbation -Continue IV Solu-Medrol per neurology -appreciate Dr. Gerilyn Pilgrim consult -PT eval -pt has some nonphysiologic components on exam--able to speak fluently, but "unable" to open mouth when asked on exam -MRI brain -endorsed compliance with Tecfidera  Leg weakness and low back pain -MRI L-spine -07/01/2015 L-spine MRI--severe L5-S1 disc degeneration with mild bilateral neural foraminal stenosis -has functional/nonphysiologic exam--she was able to lift her left arm and leg to sit up in bed and while removing socks, but able to lift on formal exam;  +Hoover sign -PT eval -UA/urine culture -UDS -B12 -TSH--1.399 -CK  Hyperglycemia -likely due to steroids -ISS -check A1C  Chronic headache -suspect a component of analgesic headace -MRI as above  Atypical chest pain -EKG -cycle troponins  Disposition Plan:   Home in 2-3 days  Family Communication:   Daughter updated at bedside 09/09/16--Total time spent 35 minutes.  Greater than 50% spent face to face counseling and coordinating care.   Consultants:  Neurology  Code Status:  FULL   DVT Prophylaxis:  Mitchell Heparin    Procedures: As Listed in Progress Note Above  Antibiotics: None    Subjective: Patient complains of low back pain and bilateral lower extremity weakness and pain unchanged since yesterday. She states that it is worse with ambulation and weightbearing. Denies any bowel or bladder incontinence. She states that her pain is dull in nature but shoots down to her legs. Just complains of chest pressure substernal moderate in nature without any radiation.  Objective: Vitals:   09/08/16 2130 09/08/16 2230 09/08/16 2300 09/08/16 2348  BP: 118/80 114/65 112/68 135/73  Pulse: 91 81 85 81  Resp: 21 20 22  (!) 22  Temp:    99.5 F (37.5 C)  TempSrc:    Oral  SpO2: 99% 98% 98% 98%  Weight:    79.1 kg (174 lb 6.1 oz)  Height:    5\' 2"  (1.575 m)   No intake or output data in the 24 hours ending 09/09/16 0843 Weight change:  Exam:   General:  Pt is alert, follows commands appropriately, not in acute distress  HEENT: No icterus, No thrush, No neck mass, [place]/AT  Cardiovascular: RRR, S1/S2, no rubs, no gallops  Respiratory: CTA bilaterally, no wheezing, no crackles, no  rhonchi  Abdomen: Soft/+BS, non tender, non distended, no guarding  Extremities: No edema, No lymphangitis, No petechiae, No rashes, no synovitis  Neuro:  CN II-XII intact, strength 4/5 in RUE, RLE, strength 3-/5 LUE, LLE; sensation intact bilateral; no dysmetria; babinski  equivocal     Data Reviewed: I have personally reviewed following labs and imaging studies Basic Metabolic Panel:  Recent Labs Lab 09/08/16 1935 09/09/16 0545  NA 138 137  K 3.4* 4.0  CL 108 108  CO2 23 18*  GLUCOSE 120* 202*  BUN 18 15  CREATININE 0.56 0.62  CALCIUM 9.0 8.9   Liver Function Tests:  Recent Labs Lab 09/08/16 1935 09/09/16 0545  AST 22 28  ALT 15 16  ALKPHOS 75 76  BILITOT 0.2* 0.3  PROT 6.9 7.1  ALBUMIN 3.7 3.5   No results for input(s): LIPASE, AMYLASE in the last 168 hours. No results for input(s): AMMONIA in the last 168 hours. Coagulation Profile:  Recent Labs Lab 09/08/16 1933  INR 0.90   CBC:  Recent Labs Lab 09/08/16 1900 09/09/16 0545  WBC 7.0 4.4  NEUTROABS 3.9  --   HGB 14.2 14.3  HCT 41.3 42.2  MCV 89.4 89.6  PLT 330 347   Cardiac Enzymes: No results for input(s): CKTOTAL, CKMB, CKMBINDEX, TROPONINI in the last 168 hours. BNP: Invalid input(s): POCBNP CBG:  Recent Labs Lab 09/09/16 0008 09/09/16 0415 09/09/16 0839  GLUCAP 114* 178* 149*   HbA1C: No results for input(s): HGBA1C in the last 72 hours. Urine analysis:    Component Value Date/Time   COLORURINE YELLOW 06/20/2015 1050   APPEARANCEUR CLEAR 06/20/2015 1050   LABSPEC 1.015 06/20/2015 1050   PHURINE 6.0 06/20/2015 1050   GLUCOSEU NEGATIVE 06/20/2015 1050   HGBUR MODERATE (A) 06/20/2015 1050   BILIRUBINUR NEGATIVE 06/20/2015 1050   KETONESUR NEGATIVE 06/20/2015 1050   PROTEINUR trace 09/28/2015 1108   PROTEINUR NEGATIVE 09/22/2007 1113   UROBILINOGEN 0.2 06/20/2015 1050   NITRITE neg 09/28/2015 1108   NITRITE NEGATIVE 06/20/2015 1050   LEUKOCYTESUR Negative 09/28/2015 1108   Sepsis Labs: @LABRCNTIP (procalcitonin:4,lacticidven:4) )No results found for this or any previous visit (from the past 240 hour(s)).   Scheduled Meds: . Dimethyl Fumarate  240 mg Oral Daily  . famotidine (PEPCID) IV  20 mg Intravenous Q12H  . heparin  5,000 Units  Subcutaneous Q8H  . insulin aspart  0-15 Units Subcutaneous Q4H  . methylPREDNISolone (SOLU-MEDROL) injection  1,000 mg Intravenous Daily  . sodium chloride flush  3 mL Intravenous Q12H   Continuous Infusions: . dextrose 5 % and 0.9% NaCl 125 mL/hr at 09/09/16 0005    Procedures/Studies: Ct Head Wo Contrast  Result Date: 09/08/2016 CLINICAL DATA:  Left-sided weakness EXAM: CT HEAD WITHOUT CONTRAST TECHNIQUE: Contiguous axial images were obtained from the base of the skull through the vertex without intravenous contrast. COMPARISON:  Brain MRI 07/01/2015 FINDINGS: Brain: No evidence of acute infarction, hemorrhage, hydrocephalus, extra-axial collection or mass effect. White matter disease that is underestimated relative to MRI in this patient with history of multiple sclerosis. 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 Vascular: Atherosclerotic calcifications seen at the left vertebral. No hyperdense vessel. Skull: Negative Sinuses/Orbits: No acute finding IMPRESSION: 1. No acute finding. 2. 3 mm colloid cyst.  No hydrocephalus. Electronically Signed   By: Marnee Spring M.D.   On: 09/08/2016 19:43   Mr Laqueta Jean ZO Contrast  Result Date: 09/09/2016 CLINICAL DATA:  Headache followed by facial numbness  and left facial and left arm weakness. History of multiple sclerosis. EXAM: MRI HEAD WITHOUT AND WITH CONTRAST TECHNIQUE: Multiplanar, multiecho pulse sequences of the brain and surrounding structures were obtained without and with intravenous contrast. CONTRAST:  16mL MULTIHANCE GADOBENATE DIMEGLUMINE 529 MG/ML IV SOLN COMPARISON:  Head CT 09/08/2016 and MRI 07/01/2015 FINDINGS: Brain: There is no evidence of acute infarct intracranial hemorrhage, midline shift, or extra-axial fluid collection. 3 mm intermediate T1 signal intensity focus at the level of the foramen of Monro is compatible with a colloid cyst as seen on CT. The ventricles are normal in size without evidence of  hydrocephalus. Small foci of T2 hyperintensity are again seen scattered throughout the cerebral white matter bilaterally. These were better demonstrated on the prior MRI due to differences in technique and field strength but have likely not significantly changed. No new or enhancing lesions are identified. No brainstem or cerebellar lesions are seen. Vascular: Major intracranial vascular flow voids are preserved. Skull and upper cervical spine: Unremarkable bone marrow signal. Sinuses/Orbits: Unremarkable orbits.  Clear sinuses. Other: None. IMPRESSION: 1. No acute intracranial abnormality. 2. Unchanged cerebral white matter lesions consistent with history of multiple sclerosis. No evidence of acute demyelination. 3. 3 mm colloid cyst. Electronically Signed   By: Sebastian Ache M.D.   On: 09/09/2016 08:20    Zyshonne Malecha, DO  [facility] Pager [phone]  If 7PM-7AM, please contact night-coverage [email or web address] Password [email or web address] 09/09/2016, 8:43 AM   LOS: 1 day

## 2016-09-09 NOTE — Evaluation (Signed)
Physical Therapy Evaluation Patient Details Name: [name] MRN: [id number] DOB: 08-20-1970 Today's Date: 09/09/2016   History of Present Illness  [name] is a 45yo white hispanic person identifying as female, who comes to [facility] after acute onset left sided weakness and left facial droop: pt admitted for MS exacerabtion. At baseline, pt has not limitations to independent community distance AMB without AD. Pt does not speak [place], but speaks [place].     Clinical Impression  Pt admitted with above diagnosis. Pt currently with functional limitations due to the deficits listed below (see "PT Problem List"). Upon entry, the patient is received semirecumbent in bed, with husband and children present who provide intermittent assistance with communicating with the patient.The pt is lethargic and agreeable to participate, but presents with minimal motivation to participate without clear reason. The patient maintains head turned away from therapist most of visit only making eye contact a handful of times. Pt c/o continued low back pain (which she has at baseline) and now new left knee pain. Pt reports most of her symptoms are more related to her face and mouth, however testing of her Left arm/leg reveal moderate weakness as detailed below.Functional mobility assessment demonstrates moderate-heavy weakness as detailed below, the patient now requiring heavy physical assistance for simple mobility, unsafe to perform household distances independently. The patient is appropriate for DC to STR, but due to a high level of family support and history of depression, will likely have better motivation at home.   Pt will benefit from skilled PT intervention to increase independence and safety with basic mobility in preparation for discharge to the venue listed below.       Follow Up Recommendations Home health PT;Supervision for mobility/OOB    Equipment Recommendations  Wheelchair  (measurements PT);3in1 (PT)    Recommendations for Other Services OT consult     Precautions / Restrictions Precautions Precautions: Fall      Mobility  Bed Mobility Overal bed mobility: Needs Assistance Bed Mobility: Supine to Sit;Sit to Supine     Supine to sit: Min assist Sit to supine: Min assist      Transfers Overall transfer level: Needs assistance Equipment used: None;1 person hand held assist Transfers: Sit to/from Stand Sit to Stand: Mod assist         General transfer comment: RUE HHA; Right weight shift due to left sided weakness.   Ambulation/Gait Ambulation/Gait assistance: Mod assist;+2 safety/equipment Ambulation Distance (Feet): 7 Feet         General Gait Details: RIght hop-to gait, with reduced weight bearing capacity on the left side, and poor foot clearance on left sde.   Stairs            Wheelchair Mobility    Modified Rankin (Stroke Patients Only)       Balance Overall balance assessment: Needs assistance         Standing balance support: Single extremity supported;During functional activity Standing balance-Leahy Scale: Poor                               Pertinent Vitals/Pain Pain Assessment: Faces Faces Pain Scale: Hurts even more Pain Location: Low back, left leg thigh to knee (does not rate numerically)  Pain Intervention(s): Limited activity within patient's tolerance;Monitored during session    Home Living Family/patient expects to be discharged to:: Private residence Living Arrangements: Spouse/significant other;Children Available Help at Discharge: Family Type of Home: House  Home Access: Stairs to enter Entrance Stairs-Rails: Right;Left;Can reach both Entrance Stairs-Number of Steps: 6 Home Layout: One level Home Equipment: Cane - single point      Prior Function Level of Independence: Independent               Hand Dominance        Extremity/Trunk Assessment   Upper  Extremity Assessment Upper Extremity Assessment: LUE deficits/detail LUE Deficits / Details: 3/5 strength in left hand, wrist, and elbow, and 2+/5 in shoulder.     Lower Extremity Assessment Lower Extremity Assessment: LLE deficits/detail LLE Deficits / Details: Left ankle: 2/5; Quads: 3/5; Hamstrings: 2/5; hip flexors: 2-/5       Communication   Communication: Prefers language other than English  Cognition Arousal/Alertness: Lethargic Behavior During Therapy: Flat affect Overall Cognitive Status: Within Functional Limits for tasks assessed                      General Comments      Exercises     Assessment/Plan    PT Assessment Patient needs continued PT services  PT Problem List Decreased strength;Decreased balance;Decreased activity tolerance;Decreased mobility;Decreased coordination;Pain;Decreased knowledge of use of DME;Decreased knowledge of precautions          PT Treatment Interventions Gait training;Functional mobility training;Stair training;Therapeutic activities;Therapeutic exercise;Balance training;Wheelchair mobility training;Patient/family education    PT Goals (Current goals can be found in the Care Plan section)  Acute Rehab PT Goals Patient Stated Goal: help aptuient return to independent mobility  PT Goal Formulation: With family Time For Goal Achievement: 09/23/16 Potential to Achieve Goals: Fair    Frequency Min 3X/week   Barriers to discharge Inaccessible home environment will need a ramp to get pt into house with WC    Co-evaluation               End of Session Equipment Utilized During Treatment: Gait belt Activity Tolerance: Patient limited by fatigue;Patient limited by pain Patient left: in bed Nurse Communication: Other (comment)         Time: 6644-0347 PT Time Calculation (min) (ACUTE ONLY): 20 min   Charges:   PT Evaluation $PT Eval Moderate Complexity: 1 Procedure     PT G Codes:        1:07 PM,  Sep 15, 2016 Rosamaria Lints, PT, DPT Physical Therapist - [place] [phone] (mobile)

## 2016-09-09 NOTE — Evaluation (Signed)
Clinical/Bedside Swallow Evaluation Patient Details  Name: [name] MRN: [id number] Date of Birth: [birth year]  Today's Date: 09/09/2016 Time: SLP Start Time (ACUTE ONLY): 1030 SLP Stop Time (ACUTE ONLY): 1100 SLP Time Calculation (min) (ACUTE ONLY): 30 min  Past Medical History:  Past Medical History:  Diagnosis Date  . Headache   . Multiple sclerosis (HCC) 06/19/2011  . Multiple sclerosis (HCC) 06/19/2011   Past Surgical History: History reviewed. No pertinent surgical history. HPI:  46 year old female with a history of multiple sclerosis, chronic headache, and depression presented with acute onset of left facial numbness, left facial droop, and left upper extremity and lower extremity weakness that began around 9 AM on 09/08/2016 with symptom progression throughout the day. The patient woke up around 8 AM on 09/08/2016 with a headache and subsequently developed the above symptoms around 9 AM with progression throughout the day. The patient also complains of increasing lower back pain that began around the same time. She denies any bowel or bladder incontinence. She was able to ambulate, but had difficulty secondary to pain. She is able to bear weight on her legs. She denies any fevers, chills, nausea, vomiting, diarrhea, dysuria, hematuria. She does complain of chest discomfort that began 09/08/2016. without any shortness of breath, dizziness, nausea. There are no alleviating or exacerbating factors regarding her chest pain. She has been taking approximately 6-8 pills of Aleve on a weekly basis for her back pain and headaches. Neurology was contacted in the emergency department and recommended admission for possible multiple sclerosis exacerbation. MRI: Unchanged cerebral white matter lesions consistent with history   Assessment / Plan / Recommendation Clinical Impression  Pt seen at bedside for clinical swallow evaluation as part of stroke protocol. Pt failed RN swallow screen  in ED. Pt's husband and daughter in room during the evaluation and provided some background information. Her daughter reports that Pt is generally independent, but that her right side is slightly weaker since diagnosis of MS. She reports that Pt now presents with left sided weakness and that her tongue has been protruding from mouth since yesterday. Pt speaks spanish and SLP offered to obtain language line for translation, however husband and daughter preferred to translate when SLP unable to communicate (some spanish). Pt does not appear to have facial asymmetry, however her tongue tip is protruding consistently outside of the left side of oral cavity. Pt appears to make little attempt to open mouth, move tongue or lips when requested. SLP provided Pt with toothette/sponge for self oral care and pt only manipulated it briefly. SLP provided resistance to cheeks and tongue and pt was able to push back. Pt verbally communicated with daughter and daughter reports that it is difficult to understand her. Pt able to repeat simple utterances with clarity ("uno, dos, tres") at times, however performance inconsistent over the course of my visit. Pt presented with single ice chips which resulted in no attempts to manipulate bolus with tongue and limited with lips; delayed swallow elicited. Attempts at self presenting (with hand over hand assist) cups sips thin water which resulted in immediate loss of bolus on right side. Pt also presented with straw sips without pt initiation. Small presentation of puree removed due to pt not attempting to swallow. Unable to recommend an oral diet given inconsistent performance today. Spoke with MD (Dr. Arbutus Leas) who is going to offer a diet. SLP will follow up again tomorrow AM.    Aspiration Risk  Other (comment) (difficult to determine given pt performance)  Diet Recommendation Other (Comment) (Pt unable to demonstrate safe/efficient intake of any po dur)   Medication Administration:  Via alternative means Postural Changes: Seated upright at 90 degrees;Remain upright for at least 30 minutes after po intake    Other  Recommendations Oral Care Recommendations: Staff/trained caregiver to provide oral care Other Recommendations: Clarify dietary restrictions   Follow up Recommendations  (pending clinical course)      Frequency and Duration min 2x/week  1 week       Prognosis Prognosis for Safe Diet Advancement: Fair Barriers to Reach Goals: Behavior      Swallow Study   General Date of Onset: 09/08/16 HPI: 46 year old female with a history of multiple sclerosis, chronic headache, and depression presented with acute onset of left facial numbness, left facial droop, and left upper extremity and lower extremity weakness that began around 9 AM on 09/08/2016 with symptom progression throughout the day. The patient woke up around 8 AM on 09/08/2016 with a headache and subsequently developed the above symptoms around 9 AM with progression throughout the day. The patient also complains of increasing lower back pain that began around the same time. She denies any bowel or bladder incontinence. She was able to ambulate, but had difficulty secondary to pain. She is able to bear weight on her legs. She denies any fevers, chills, nausea, vomiting, diarrhea, dysuria, hematuria. She does complain of chest discomfort that began 09/08/2016. without any shortness of breath, dizziness, nausea. There are no alleviating or exacerbating factors regarding her chest pain. She has been taking approximately 6-8 pills of Aleve on a weekly basis for her back pain and headaches. Neurology was contacted in the emergency department and recommended admission for possible multiple sclerosis exacerbation. MRI: Unchanged cerebral white matter lesions consistent with history Type of Study: Bedside Swallow Evaluation Previous Swallow Assessment: None on record Diet Prior to this Study: NPO Temperature Spikes  Noted: No Respiratory Status: Room air History of Recent Intubation: No Behavior/Cognition: Alert Oral Cavity Assessment: Within Functional Limits (difficult to have pt open mouth) Oral Care Completed by SLP: Yes Oral Cavity - Dentition: Adequate natural dentition Vision: Functional for self-feeding Self-Feeding Abilities: Needs assist Patient Positioning: Upright in bed Baseline Vocal Quality: Normal Volitional Cough: Weak Volitional Swallow: Able to elicit    Oral/Motor/Sensory Function Overall Oral Motor/Sensory Function: Other (comment) (see clinical impression)   Ice Chips Ice chips: Impaired Presentation: Spoon Oral Phase Impairments: Reduced lingual movement/coordination;Reduced labial seal;Impaired mastication Oral Phase Functional Implications: Right anterior spillage;Oral holding;Prolonged oral transit Pharyngeal Phase Impairments: Suspected delayed Swallow   Thin Liquid Thin Liquid: Impaired Presentation: Cup;Self Fed Oral Phase Impairments: Reduced labial seal;Reduced lingual movement/coordination Oral Phase Functional Implications: Right anterior spillage    Nectar Thick Nectar Thick Liquid: Not tested   Honey Thick Honey Thick Liquid: Not tested   Puree Puree: Impaired Presentation: Spoon Oral Phase Impairments: Reduced labial seal;Reduced lingual movement/coordination Oral Phase Functional Implications: Oral residue   Solid   Thank you,  Havery Moros, CCC-SLP [phone]    Solid: Not tested        Lazariah Savard 09/09/2016,1:51 PM

## 2016-09-09 NOTE — Consult Note (Signed)
[place] A. Merlene Laughter, MD     [email or web address]          [name] is an 46 y.o. female.   ASSESSMENT/PLAN: Acute left hemiparesis and dysphagia likely related to relapse of multiple sclerosis. The patient was started on 3 days of IV high dose Solu-Medrol. We may extend his to 5 days depending on how she responds. We are to continue her baseline disease modifying therapy with tefidera. Physical and occupational therapies are recommended. I will do a cervical spine MRI without contrast.  Low back pain due to lumbar disc disease seen on recent lumbar spine MRI.   The patient is a 46 year old Hispanic American female who has a baseline history of multiple sclerosis. She was being followed by [place] a few years ago but has not been followed in [place]. She is on take flareup. History obtained by the daughter as the patient sees very little [place]. They report that she has been compliant with her disease modifying treatment. She developed the acute onset of left hemiparesis and numbness and tingling in the left side. It appears that she's had some difficulty speaking. She did have some headache on yesterday. I think she has baseline history of headaches and low back pain. The low back pain and headache both appears undergone worse the last few days. The daugter also endorsed the patient having some dyspnea on exertion on yesterday while she is continuing emergency room there is no reports of chest pain. Review of systems otherwise limited but unremarkable.   GENERAL:   HEENT: Supple. Atraumatic normocephalic.   ABDOMEN: soft  EXTREMITIES: No edema   BACK: Normal.  SKIN: Normal by inspection.    MENTAL STATUS: Patient is awake and alert. She does follow commands. There appears to be more marked dysarthria.   CRANIAL NERVES: Pupils are equal, round and reactive to light and accommodation; extra ocular movements are full, there is no significant  nystagmus; visual fields are full; upper and lower facial muscles are normal in strength and symmetric, there is no flattening of the nasolabial folds; tongue deviates to the left;   MOTOR: Left upper extremity weakness graded as 2/5, left leg weakness graded as 3/5, right side is graded as 4/5.  COORDINATION: Left finger to nose is normal, right finger to nose is normal, No rest tremor; no intention tremor; no postural tremor; no bradykinesia.  REFLEXES: Deep tendon reflexes are symmetrical and normal. Babinski reflexes are flexor bilaterally.   SENSATION: Diminished sensation involving the left upper extremity and left leg.      The brain MRI is reviewed and shows about 8 deep white matter lesions. The largest involved the posterior left deep white matter areas near to the lateral ventricular system. It is seen on 2-3 cuts. No contrast enhancement is appreciated.    Blood pressure 135/73, pulse 81, temperature 99.5 F (37.5 C), temperature source Oral, resp. rate (!) 22, height '5\' 2"'  (1.575 m), weight 174 lb 6.1 oz (79.1 kg), SpO2 98 %.  Past Medical History:  Diagnosis Date  . Headache   . Multiple sclerosis ([place]) 06/19/2011  . Multiple sclerosis ([place]) 06/19/2011    History reviewed. No pertinent surgical history.  Family History  Problem Relation Age of Onset  . Hypertension Mother     Social History:  reports that she has never smoked. She has never used smokeless tobacco. She reports that she does not drink alcohol or use drugs.  Allergies: No Known Allergies  Medications: Prior  to Admission medications   Medication Sig Start Date End Date Taking? Authorizing Provider  aspirin EC 325 MG tablet Take 325-650 mg by mouth daily as needed for mild pain.   Yes Historical Provider, MD  Dimethyl Fumarate (TECFIDERA) 240 MG CPDR Take 240 mg by mouth daily.    Yes Historical Provider, MD  etonogestrel (NEXPLANON) 68 MG IMPL implant 1 each by Subdermal route once.   Yes  Historical Provider, MD    Scheduled Meds: . Dimethyl Fumarate  240 mg Oral Daily  . famotidine (PEPCID) IV  20 mg Intravenous Q12H  . heparin  5,000 Units Subcutaneous Q8H  . insulin aspart  0-15 Units Subcutaneous Q4H  . methylPREDNISolone (SOLU-MEDROL) injection  1,000 mg Intravenous Daily  . sodium chloride flush  3 mL Intravenous Q12H   Continuous Infusions: . sodium chloride 0.9 % 1,000 mL infusion     PRN Meds:.aspirin EC     Results for orders placed or performed during the hospital encounter of 09/08/16 (from the past 48 hour(s))  CBC     Status: None   Collection Time: 09/08/16  7:00 PM  Result Value Ref Range   WBC 7.0 4.0 - 10.5 K/uL   RBC 4.62 3.87 - 5.11 MIL/uL   Hemoglobin 14.2 12.0 - 15.0 g/dL   HCT 41.3 36.0 - 46.0 %   MCV 89.4 78.0 - 100.0 fL   MCH 30.7 26.0 - 34.0 pg   MCHC 34.4 30.0 - 36.0 g/dL   RDW 13.7 11.5 - 15.5 %   Platelets 330 150 - 400 K/uL  Differential     Status: None   Collection Time: 09/08/16  7:00 PM  Result Value Ref Range   Neutrophils Relative % 55 %   Neutro Abs 3.9 1.7 - 7.7 K/uL   Lymphocytes Relative 32 %   Lymphs Abs 2.2 0.7 - 4.0 K/uL   Monocytes Relative 9 %   Monocytes Absolute 0.6 0.1 - 1.0 K/uL   Eosinophils Relative 3 %   Eosinophils Absolute 0.2 0.0 - 0.7 K/uL   Basophils Relative 1 %   Basophils Absolute 0.1 0.0 - 0.1 K/uL  Protime-INR     Status: None   Collection Time: 09/08/16  7:33 PM  Result Value Ref Range   Prothrombin Time 12.2 11.4 - 15.2 seconds   INR 0.90   APTT     Status: None   Collection Time: 09/08/16  7:33 PM  Result Value Ref Range   aPTT 30 24 - 36 seconds  TSH     Status: None   Collection Time: 09/08/16  7:33 PM  Result Value Ref Range   TSH 1.399 0.350 - 4.500 uIU/mL    Comment: Performed by a 3rd Generation assay with a functional sensitivity of <=0.01 uIU/mL.  Comprehensive metabolic panel     Status: Abnormal   Collection Time: 09/08/16  7:35 PM  Result Value Ref Range   Sodium  138 135 - 145 mmol/L   Potassium 3.4 (L) 3.5 - 5.1 mmol/L   Chloride 108 101 - 111 mmol/L   CO2 23 22 - 32 mmol/L   Glucose, Bld 120 (H) 65 - 99 mg/dL   BUN 18 6 - 20 mg/dL   Creatinine, Ser 0.56 0.44 - 1.00 mg/dL   Calcium 9.0 8.9 - 10.3 mg/dL   Total Protein 6.9 6.5 - 8.1 g/dL   Albumin 3.7 3.5 - 5.0 g/dL   AST 22 15 - 41 U/L   ALT 15 14 -  54 U/L   Alkaline Phosphatase 75 38 - 126 U/L   Total Bilirubin 0.2 (L) 0.3 - 1.2 mg/dL   GFR calc non Af Amer >60 >60 mL/min   GFR calc Af Amer >60 >60 mL/min    Comment: (NOTE) The eGFR has been calculated using the CKD EPI equation. This calculation has not been validated in all clinical situations. eGFR's persistently <60 mL/min signify possible Chronic Kidney Disease.    Anion gap 7 5 - 15  hCG, quantitative, pregnancy     Status: None   Collection Time: 09/08/16  7:36 PM  Result Value Ref Range   hCG, Beta Chain, Quant, S <1 <5 mIU/mL    Comment:          GEST. AGE      CONC.  (mIU/mL)   <=1 WEEK        5 - 50     2 WEEKS       50 - 500     3 WEEKS       100 - 10,000     4 WEEKS     1,000 - 30,000     5 WEEKS     3,500 - 115,000   6-8 WEEKS     12,000 - 270,000    12 WEEKS     15,000 - 220,000        FEMALE AND NON-PREGNANT FEMALE:     LESS THAN 5 mIU/mL   Glucose, capillary     Status: Abnormal   Collection Time: 09/09/16 12:08 AM  Result Value Ref Range   Glucose-Capillary 114 (H) 65 - 99 mg/dL   Comment 1 Notify RN    Comment 2 Document in Chart   Glucose, capillary     Status: Abnormal   Collection Time: 09/09/16  4:15 AM  Result Value Ref Range   Glucose-Capillary 178 (H) 65 - 99 mg/dL   Comment 1 Notify RN    Comment 2 Document in Chart   Comprehensive metabolic panel     Status: Abnormal   Collection Time: 09/09/16  5:45 AM  Result Value Ref Range   Sodium 137 135 - 145 mmol/L   Potassium 4.0 3.5 - 5.1 mmol/L   Chloride 108 101 - 111 mmol/L   CO2 18 (L) 22 - 32 mmol/L   Glucose, Bld 202 (H) 65 - 99 mg/dL    BUN 15 6 - 20 mg/dL   Creatinine, Ser 0.62 0.44 - 1.00 mg/dL   Calcium 8.9 8.9 - 10.3 mg/dL   Total Protein 7.1 6.5 - 8.1 g/dL   Albumin 3.5 3.5 - 5.0 g/dL   AST 28 15 - 41 U/L   ALT 16 14 - 54 U/L   Alkaline Phosphatase 76 38 - 126 U/L   Total Bilirubin 0.3 0.3 - 1.2 mg/dL   GFR calc non Af Amer >60 >60 mL/min   GFR calc Af Amer >60 >60 mL/min    Comment: (NOTE) The eGFR has been calculated using the CKD EPI equation. This calculation has not been validated in all clinical situations. eGFR's persistently <60 mL/min signify possible Chronic Kidney Disease.    Anion gap 11 5 - 15  CBC     Status: None   Collection Time: 09/09/16  5:45 AM  Result Value Ref Range   WBC 4.4 4.0 - 10.5 K/uL   RBC 4.71 3.87 - 5.11 MIL/uL   Hemoglobin 14.3 12.0 - 15.0 g/dL   HCT 42.2 36.0 - 46.0 %  MCV 89.6 78.0 - 100.0 fL   MCH 30.4 26.0 - 34.0 pg   MCHC 33.9 30.0 - 36.0 g/dL   RDW 13.5 11.5 - 15.5 %   Platelets 347 150 - 400 K/uL  Glucose, capillary     Status: Abnormal   Collection Time: 09/09/16  8:39 AM  Result Value Ref Range   Glucose-Capillary 149 (H) 65 - 99 mg/dL   Comment 1 Notify RN    Comment 2 Document in Chart     Studies/Results:     Atlee Kluth A. Merlene Laughter, M.D.  Diplomate, Tax adviser of Psychiatry and Neurology ( Neurology). 09/09/2016, 9:54 AM

## 2016-09-10 ENCOUNTER — Inpatient Hospital Stay (HOSPITAL_COMMUNITY): Payer: BLUE CROSS/BLUE SHIELD

## 2016-09-10 DIAGNOSIS — R262 Difficulty in walking, not elsewhere classified: Secondary | ICD-10-CM

## 2016-09-10 DIAGNOSIS — G8194 Hemiplegia, unspecified affecting left nondominant side: Secondary | ICD-10-CM

## 2016-09-10 LAB — GLUCOSE, CAPILLARY
Glucose-Capillary: 126 mg/dL — ABNORMAL HIGH (ref 65–99)
Glucose-Capillary: 129 mg/dL — ABNORMAL HIGH (ref 65–99)
Glucose-Capillary: 136 mg/dL — ABNORMAL HIGH (ref 65–99)
Glucose-Capillary: 147 mg/dL — ABNORMAL HIGH (ref 65–99)
Glucose-Capillary: 153 mg/dL — ABNORMAL HIGH (ref 65–99)
Glucose-Capillary: 166 mg/dL — ABNORMAL HIGH (ref 65–99)

## 2016-09-10 LAB — BASIC METABOLIC PANEL
Anion gap: 7 (ref 5–15)
BUN: 25 mg/dL — ABNORMAL HIGH (ref 6–20)
CO2: 21 mmol/L — ABNORMAL LOW (ref 22–32)
Calcium: 8.5 mg/dL — ABNORMAL LOW (ref 8.9–10.3)
Chloride: 112 mmol/L — ABNORMAL HIGH (ref 101–111)
Creatinine, Ser: 0.56 mg/dL (ref 0.44–1.00)
GFR calc Af Amer: >60 mL/min (ref >60)
GFR calc non Af Amer: >60 mL/min (ref >60)
Glucose, Bld: 157 mg/dL — ABNORMAL HIGH (ref 65–99)
Potassium: 3.7 mmol/L (ref 3.5–5.1)
Sodium: 140 mmol/L (ref 135–145)

## 2016-09-10 LAB — HEMOGLOBIN A1C
Hgb A1c MFr Bld: 5.6 % (ref 4.8–5.6)
Mean Plasma Glucose: 114 mg/dL

## 2016-09-10 LAB — MAGNESIUM: Magnesium: 2.2 mg/dL (ref 1.7–2.4)

## 2016-09-10 LAB — HIV ANTIBODY (ROUTINE TESTING W REFLEX): HIV Screen 4th Generation wRfx: NONREACTIVE

## 2016-09-10 MED ORDER — INSULIN ASPART 100 UNIT/ML ~~LOC~~ SOLN: 0.0000 [IU] | Freq: Every day | SUBCUTANEOUS | Status: DC

## 2016-09-10 MED ORDER — INSULIN ASPART 100 UNIT/ML ~~LOC~~ SOLN
0.0000 [IU] | Freq: Three times a day (TID) | SUBCUTANEOUS | Status: DC
  Administered 2016-09-10: 2 [IU] via SUBCUTANEOUS
  Administered 2016-09-11 (×2): 3 [IU] via SUBCUTANEOUS

## 2016-09-10 NOTE — Progress Notes (Signed)
Physical Therapy Treatment Patient Details Name: [name] MRN: [id number] DOB: 02-02-1971 Today's Date: 09/10/2016    History of Present Illness [name] is a 45yo white hispanic person identifying as female, who comes to [facility] after acute onset left sided weakness and left facial droop: pt admitted for MS exacerabtion. At baseline, pt has not limitations to independent community distance AMB without AD. Pt does not speak [place], but speaks [place].     PT Comments    Pt in bed upon arrival with husband present to help translate.  Pt agreeable to therapy today.  Pt with much improved ability to complete transfers and mobility today.  Now with contact guard with transfers and gait.  Pt ambulates very slowly with cues to take larger steps with Rt LE and increase weight bearing on Lt LE.  Pt encouraged to complete LE exercises while in bed at least 2X daily.  Pt verbalized understanding.  Pt returned to supine position    Follow Up Recommendations  Supervision for mobility/OOB;Outpatient PT     Equipment Recommendations  Wheelchair (measurements PT);3in1 (PT)    Recommendations for Other Services OT consult     Precautions / Restrictions Precautions Precautions: Fall    Mobility  Bed Mobility Overal bed mobility: Modified Independent Bed Mobility: Supine to Sit;Sit to Supine     Supine to sit: Modified independent (Device/Increase time) Sit to supine: Modified independent (Device/Increase time)      Transfers Overall transfer level: Needs assistance Equipment used: Rolling walker (2 wheeled) Transfers: Sit to/from Stand Sit to Stand: Min guard            Ambulation/Gait Ambulation/Gait assistance: Min guard Ambulation Distance (Feet): 80 Feet Assistive device: Rolling walker (2 wheeled) Gait Pattern/deviations: Step-to pattern;Decreased step length - right;Decreased stance time - left   Gait velocity interpretation: <1.8 ft/sec,  indicative of risk for recurrent falls           Cognition Arousal/Alertness: Awake/alert Behavior During Therapy: WFL for tasks assessed/performed Overall Cognitive Status: Within Functional Limits for tasks assessed                 General Comments: Patients husband present to interpret as patient only speaks Technical brewer Exercises - Lower Extremity Heel Slides: AROM;Both;5 reps;Supine Straight Leg Raises: AROM;Both;5 reps;Supine        Pertinent Vitals/Pain Pain Assessment: No/denies pain           PT Goals (current goals can now be found in the care plan section) Progress towards PT goals: Progressing toward goals    Frequency    Min 3X/week      PT Plan Current plan remains appropriate       End of Session Equipment Utilized During Treatment: Gait belt Activity Tolerance: Patient limited by fatigue Patient left: in bed;with family/visitor present;with call bell/phone within reach     Time: 0445-0520 PT Time Calculation (min) (ACUTE ONLY): 35 min  Charges:  $Gait Training: 8-22 mins $Therapeutic Activity: 8-22 mins           Lurena Nida, PTA/CLT [phone] 09/10/2016, 5:45 PM

## 2016-09-10 NOTE — Care Management Note (Signed)
Case Management Note  Patient Details  Name: [name] MRN: [id number] Date of Birth: 05/07/1971  Subjective/Objective:                  Pt admitted with MS. She is from home, lives with his wife and is ind with ADL's at baseline. She has inusrance and PCP. She has done OP PT in the past but PT is currently recommending HH PT with WC and 3-in-1. DME ordered. PT has chosen [facility] from list of DME providers. Alroy Bailiff, of [facility], aware of referral and will obtain pt info from chart and deliver DME to pt room prior to DC. Pt and husband would like to discuss [facility] PT further before making final decision.   Action/Plan: CM will cont to follow.   Expected Discharge Date:     09/12/2016             Expected Discharge Plan:  Home w Home Health Services  In-House Referral:  NA  Discharge planning Services  CM Consult  Post Acute Care Choice:  Durable Medical Equipment, Home Health Choice offered to:  Spouse, Patient  DME Arranged:  3-N-1, Wheelchair manual DME Agency:  [facility]  Status of Service:  In process, will continue to follow  Malcolm Metro, RN 09/10/2016, 12:14 PM

## 2016-09-10 NOTE — Progress Notes (Signed)
[place] A. Merlene Laughter, MD     [email or web address]          [name] is an 46 y.o. female.   ASSESSMENT/PLAN:  UPDATE 09-10-16 The patient seems moderately improved today. I suspect that we may only need to infuse her for 3 days. We'll continue with the third infusion follow her exam tomorrow. Follow-up imaging done today of the cervical spine, thoracic and lumbar spine.    Acute left hemiparesis and dysphagia likely related to relapse of multiple sclerosis. The patient was started on 3 days of IV high dose Solu-Medrol. We may extend his to 5 days depending on how she responds. We are to continue her baseline disease modifying therapy with tefidera. Physical and occupational therapies are recommended. I will do a cervical spine MRI without contrast.   Low back pain due to lumbar disc disease seen on recent lumbar spine MRI.      GENERAL: Doing fair.  HEENT: Supple. Atraumatic normocephalic.   ABDOMEN: soft  EXTREMITIES: No edema   BACK: Normal.  SKIN: Normal by inspection.    MENTAL STATUS: Patient is awake and alert. She does follow commands. There appears to be more marked dysarthria.   CRANIAL NERVES: Pupils are equal, round and reactive to light and accommodation; extra ocular movements are full, there is no significant nystagmus; visual fields are full; upper and lower facial muscles are normal in strength and symmetric, there is no flattening of the nasolabial folds; tongue deviates to the left;   MOTOR: Left upper extremity 3/5 and left leg 4/5. Again, the right side is 5/5.  COORDINATION: Left finger to nose is normal, right finger to nose is normal, No rest tremor; no intention tremor; no postural tremor; no bradykinesia.  REFLEXES: Deep tendon reflexes are symmetrical and normal. Babinski reflexes are flexor bilaterally.   SENSATION: Diminished sensation involving the left upper extremity and left leg.      The brain MRI  is reviewed and shows about 8 deep white matter lesions. The largest involved the posterior left deep white matter areas near to the lateral ventricular system. It is seen on 2-3 cuts. No contrast enhancement is appreciated.    Blood pressure 103/66, pulse 78, temperature 98.6 F (37 C), temperature source Oral, resp. rate 20, height '5\' 2"'  (1.575 m), weight 174 lb 6.1 oz (79.1 kg), SpO2 99 %.  Past Medical History:  Diagnosis Date  . Headache   . Multiple sclerosis ([place]) 06/19/2011  . Multiple sclerosis ([place]) 06/19/2011    History reviewed. No pertinent surgical history.  Family History  Problem Relation Age of Onset  . Hypertension Mother     Social History:  reports that she has never smoked. She has never used smokeless tobacco. She reports that she does not drink alcohol or use drugs.  Allergies: No Known Allergies  Medications: Prior to Admission medications   Medication Sig Start Date End Date Taking? Authorizing Provider  aspirin EC 325 MG tablet Take 325-650 mg by mouth daily as needed for mild pain.   Yes Historical Provider, MD  Dimethyl Fumarate (TECFIDERA) 240 MG CPDR Take 240 mg by mouth daily.    Yes Historical Provider, MD  etonogestrel (NEXPLANON) 68 MG IMPL implant 1 each by Subdermal route once.   Yes Historical Provider, MD    Scheduled Meds: . Dimethyl Fumarate  240 mg Oral Daily  . famotidine (PEPCID) IV  20 mg Intravenous Q12H  . heparin  5,000 Units Subcutaneous Q8H  .  insulin aspart  0-15 Units Subcutaneous Q4H  . ketorolac  30 mg Intravenous Q6H  . methylPREDNISolone (SOLU-MEDROL) injection  1,000 mg Intravenous Daily  . sodium chloride flush  3 mL Intravenous Q12H   Continuous Infusions: . sodium chloride 75 mL/hr at 09/09/16 2248   PRN Meds:.aspirin EC, morphine injection, traMADol     Results for orders placed or performed during the hospital encounter of 09/08/16 (from the past 48 hour(s))  CBC     Status: None   Collection Time:  09/08/16  7:00 PM  Result Value Ref Range   WBC 7.0 4.0 - 10.5 K/uL   RBC 4.62 3.87 - 5.11 MIL/uL   Hemoglobin 14.2 12.0 - 15.0 g/dL   HCT 41.3 36.0 - 46.0 %   MCV 89.4 78.0 - 100.0 fL   MCH 30.7 26.0 - 34.0 pg   MCHC 34.4 30.0 - 36.0 g/dL   RDW 13.7 11.5 - 15.5 %   Platelets 330 150 - 400 K/uL  Differential     Status: None   Collection Time: 09/08/16  7:00 PM  Result Value Ref Range   Neutrophils Relative % 55 %   Neutro Abs 3.9 1.7 - 7.7 K/uL   Lymphocytes Relative 32 %   Lymphs Abs 2.2 0.7 - 4.0 K/uL   Monocytes Relative 9 %   Monocytes Absolute 0.6 0.1 - 1.0 K/uL   Eosinophils Relative 3 %   Eosinophils Absolute 0.2 0.0 - 0.7 K/uL   Basophils Relative 1 %   Basophils Absolute 0.1 0.0 - 0.1 K/uL  Protime-INR     Status: None   Collection Time: 09/08/16  7:33 PM  Result Value Ref Range   Prothrombin Time 12.2 11.4 - 15.2 seconds   INR 0.90   APTT     Status: None   Collection Time: 09/08/16  7:33 PM  Result Value Ref Range   aPTT 30 24 - 36 seconds  TSH     Status: None   Collection Time: 09/08/16  7:33 PM  Result Value Ref Range   TSH 1.399 0.350 - 4.500 uIU/mL    Comment: Performed by a 3rd Generation assay with a functional sensitivity of <=0.01 uIU/mL.  Comprehensive metabolic panel     Status: Abnormal   Collection Time: 09/08/16  7:35 PM  Result Value Ref Range   Sodium 138 135 - 145 mmol/L   Potassium 3.4 (L) 3.5 - 5.1 mmol/L   Chloride 108 101 - 111 mmol/L   CO2 23 22 - 32 mmol/L   Glucose, Bld 120 (H) 65 - 99 mg/dL   BUN 18 6 - 20 mg/dL   Creatinine, Ser 0.56 0.44 - 1.00 mg/dL   Calcium 9.0 8.9 - 10.3 mg/dL   Total Protein 6.9 6.5 - 8.1 g/dL   Albumin 3.7 3.5 - 5.0 g/dL   AST 22 15 - 41 U/L   ALT 15 14 - 54 U/L   Alkaline Phosphatase 75 38 - 126 U/L   Total Bilirubin 0.2 (L) 0.3 - 1.2 mg/dL   GFR calc non Af Amer >60 >60 mL/min   GFR calc Af Amer >60 >60 mL/min    Comment: (NOTE) The eGFR has been calculated using the CKD EPI equation. This  calculation has not been validated in all clinical situations. eGFR's persistently <60 mL/min signify possible Chronic Kidney Disease.    Anion gap 7 5 - 15  hCG, quantitative, pregnancy     Status: None   Collection Time: 09/08/16  7:36  PM  Result Value Ref Range   hCG, Beta Chain, Quant, S <1 <5 mIU/mL    Comment:          GEST. AGE      CONC.  (mIU/mL)   <=1 WEEK        5 - 50     2 WEEKS       50 - 500     3 WEEKS       100 - 10,000     4 WEEKS     1,000 - 30,000     5 WEEKS     3,500 - 115,000   6-8 WEEKS     12,000 - 270,000    12 WEEKS     15,000 - 220,000        FEMALE AND NON-PREGNANT FEMALE:     LESS THAN 5 mIU/mL   Glucose, capillary     Status: Abnormal   Collection Time: 09/09/16 12:08 AM  Result Value Ref Range   Glucose-Capillary 114 (H) 65 - 99 mg/dL   Comment 1 Notify RN    Comment 2 Document in Chart   Glucose, capillary     Status: Abnormal   Collection Time: 09/09/16  4:15 AM  Result Value Ref Range   Glucose-Capillary 178 (H) 65 - 99 mg/dL   Comment 1 Notify RN    Comment 2 Document in Chart   Comprehensive metabolic panel     Status: Abnormal   Collection Time: 09/09/16  5:45 AM  Result Value Ref Range   Sodium 137 135 - 145 mmol/L   Potassium 4.0 3.5 - 5.1 mmol/L   Chloride 108 101 - 111 mmol/L   CO2 18 (L) 22 - 32 mmol/L   Glucose, Bld 202 (H) 65 - 99 mg/dL   BUN 15 6 - 20 mg/dL   Creatinine, Ser 0.62 0.44 - 1.00 mg/dL   Calcium 8.9 8.9 - 10.3 mg/dL   Total Protein 7.1 6.5 - 8.1 g/dL   Albumin 3.5 3.5 - 5.0 g/dL   AST 28 15 - 41 U/L   ALT 16 14 - 54 U/L   Alkaline Phosphatase 76 38 - 126 U/L   Total Bilirubin 0.3 0.3 - 1.2 mg/dL   GFR calc non Af Amer >60 >60 mL/min   GFR calc Af Amer >60 >60 mL/min    Comment: (NOTE) The eGFR has been calculated using the CKD EPI equation. This calculation has not been validated in all clinical situations. eGFR's persistently <60 mL/min signify possible Chronic Kidney Disease.    Anion gap 11 5 - 15    CBC     Status: None   Collection Time: 09/09/16  5:45 AM  Result Value Ref Range   WBC 4.4 4.0 - 10.5 K/uL   RBC 4.71 3.87 - 5.11 MIL/uL   Hemoglobin 14.3 12.0 - 15.0 g/dL   HCT 42.2 36.0 - 46.0 %   MCV 89.6 78.0 - 100.0 fL   MCH 30.4 26.0 - 34.0 pg   MCHC 33.9 30.0 - 36.0 g/dL   RDW 13.5 11.5 - 15.5 %   Platelets 347 150 - 400 K/uL  Glucose, capillary     Status: Abnormal   Collection Time: 09/09/16  8:39 AM  Result Value Ref Range   Glucose-Capillary 149 (H) 65 - 99 mg/dL   Comment 1 Notify RN    Comment 2 Document in Chart   Urine rapid drug screen (hosp performed)     Status:  None   Collection Time: 09/09/16  9:02 AM  Result Value Ref Range   Opiates NONE DETECTED NONE DETECTED   Cocaine NONE DETECTED NONE DETECTED   Benzodiazepines NONE DETECTED NONE DETECTED   Amphetamines NONE DETECTED NONE DETECTED   Tetrahydrocannabinol NONE DETECTED NONE DETECTED   Barbiturates NONE DETECTED NONE DETECTED    Comment:        DRUG SCREEN FOR MEDICAL PURPOSES ONLY.  IF CONFIRMATION IS NEEDED FOR ANY PURPOSE, NOTIFY LAB WITHIN 5 DAYS.        LOWEST DETECTABLE LIMITS FOR URINE DRUG SCREEN Drug Class       Cutoff (ng/mL) Amphetamine      1000 Barbiturate      200 Benzodiazepine   937 Tricyclics       169 Opiates          300 Cocaine          300 THC              50   Urinalysis, Routine w reflex microscopic     Status: Abnormal   Collection Time: 09/09/16  9:02 AM  Result Value Ref Range   Color, Urine YELLOW YELLOW   APPearance TURBID (A) CLEAR   Specific Gravity, Urine >1.030 (H) 1.005 - 1.030   pH 5.5 5.0 - 8.0   Glucose, UA 250 (A) NEGATIVE mg/dL   Hgb urine dipstick NEGATIVE NEGATIVE   Bilirubin Urine NEGATIVE NEGATIVE   Ketones, ur TRACE (A) NEGATIVE mg/dL   Protein, ur NEGATIVE NEGATIVE mg/dL   Nitrite NEGATIVE NEGATIVE   Leukocytes, UA NEGATIVE NEGATIVE  Urinalysis, Microscopic (reflex)     Status: Abnormal   Collection Time: 09/09/16  9:02 AM  Result Value  Ref Range   RBC / HPF NONE SEEN 0 - 5 RBC/hpf   WBC, UA NONE SEEN 0 - 5 WBC/hpf   Bacteria, UA FEW (A) NONE SEEN   Squamous Epithelial / LPF 0-5 (A) NONE SEEN  Vitamin B12     Status: None   Collection Time: 09/09/16  9:47 AM  Result Value Ref Range   Vitamin B-12 398 180 - 914 pg/mL    Comment: (NOTE) This assay is not validated for testing neonatal or myeloproliferative syndrome specimens for Vitamin B12 levels. Performed at [facility], [place] [address], [place], [place] [address]   CK     Status: None   Collection Time: 09/09/16  9:47 AM  Result Value Ref Range   Total CK 52 38 - 234 U/L  Hemoglobin A1c     Status: None   Collection Time: 09/09/16  9:47 AM  Result Value Ref Range   Hgb A1c MFr Bld 5.6 4.8 - 5.6 %    Comment: (NOTE)         Pre-diabetes: 5.7 - 6.4         Diabetes: >6.4         Glycemic control for adults with diabetes: <7.0    Mean Plasma Glucose 114 mg/dL    Comment: (NOTE) Performed At: [facility] [place], [place] [id number] Lindon Romp MD [id number]   Troponin I (q 6hr x 3)     Status: None   Collection Time: 09/09/16  9:47 AM  Result Value Ref Range   Troponin I <0.03 <0.03 ng/mL  HIV antibody     Status: None   Collection Time: 09/09/16  9:47 AM  Result Value Ref Range   HIV Screen 4th Generation  wRfx Non Reactive Non Reactive    Comment: (NOTE) Performed At: [facility] [place], [place] [id number] Lindon Romp MD [id number]   Magnesium     Status: None   Collection Time: 09/09/16  9:47 AM  Result Value Ref Range   Magnesium 1.9 1.7 - 2.4 mg/dL  Glucose, capillary     Status: Abnormal   Collection Time: 09/09/16 12:34 PM  Result Value Ref Range   Glucose-Capillary 142 (H) 65 - 99 mg/dL   Comment 1 Notify RN    Comment 2 Document in Chart   Troponin I (q 6hr x 3)     Status: None   Collection Time: 09/09/16  2:46 PM  Result Value Ref Range   Troponin I <0.03 <0.03  ng/mL  Glucose, capillary     Status: Abnormal   Collection Time: 09/09/16  4:31 PM  Result Value Ref Range   Glucose-Capillary 132 (H) 65 - 99 mg/dL  Glucose, capillary     Status: Abnormal   Collection Time: 09/09/16  8:02 PM  Result Value Ref Range   Glucose-Capillary 119 (H) 65 - 99 mg/dL   Comment 1 Notify RN    Comment 2 Document in Chart   Troponin I (q 6hr x 3)     Status: None   Collection Time: 09/09/16  8:58 PM  Result Value Ref Range   Troponin I <0.03 <0.03 ng/mL  Glucose, capillary     Status: Abnormal   Collection Time: 09/10/16 12:15 AM  Result Value Ref Range   Glucose-Capillary 136 (H) 65 - 99 mg/dL  Glucose, capillary     Status: Abnormal   Collection Time: 09/10/16  4:16 AM  Result Value Ref Range   Glucose-Capillary 147 (H) 65 - 99 mg/dL  Basic metabolic panel     Status: Abnormal   Collection Time: 09/10/16  6:10 AM  Result Value Ref Range   Sodium 140 135 - 145 mmol/L   Potassium 3.7 3.5 - 5.1 mmol/L   Chloride 112 (H) 101 - 111 mmol/L   CO2 21 (L) 22 - 32 mmol/L   Glucose, Bld 157 (H) 65 - 99 mg/dL   BUN 25 (H) 6 - 20 mg/dL   Creatinine, Ser 0.56 0.44 - 1.00 mg/dL   Calcium 8.5 (L) 8.9 - 10.3 mg/dL   GFR calc non Af Amer >60 >60 mL/min   GFR calc Af Amer >60 >60 mL/min    Comment: (NOTE) The eGFR has been calculated using the CKD EPI equation. This calculation has not been validated in all clinical situations. eGFR's persistently <60 mL/min signify possible Chronic Kidney Disease.    Anion gap 7 5 - 15  Magnesium     Status: None   Collection Time: 09/10/16  6:10 AM  Result Value Ref Range   Magnesium 2.2 1.7 - 2.4 mg/dL  Glucose, capillary     Status: Abnormal   Collection Time: 09/10/16  8:02 AM  Result Value Ref Range   Glucose-Capillary 166 (H) 65 - 99 mg/dL   Comment 1 Notify RN    Comment 2 Document in Chart     Studies/Results:     Ayani Ospina A. Merlene Laughter, M.D.  Diplomate, Tax adviser of Psychiatry and Neurology (  Neurology). 09/10/2016, 9:43 AM

## 2016-09-10 NOTE — Progress Notes (Signed)
Speech Language Pathology Treatment: Dysphagia  Patient Details Name: [name] MRN: [id number] DOB: Dec 29, 1970 Today's Date: 09/10/2016 Time: 1315-1340 SLP Time Calculation (min) (ACUTE ONLY): 25 min  Assessment / Plan / Recommendation Clinical Impression  Pt seen at bedside for ongoing diagnostic dysphagia intervention following clinical swallow evaluation completed yesterday. Pt was able to reposition herself upright in bed for po trials. Her son, at bedside, reports a slight improvement since yesterday. He stated that she has not been eating, but did drink some water. Pt able to demonstrate oral intake of water via straw sips (she compensates for protrusion of tongue by placing straw between top of tongue and the bottom of her upper lip. Pt then encouraged to self feed bites of ice cream due to lip pursing when SLP presents. Pt able to take several bites without overt signs of symptoms of decreased airway protection. SLP left room to get a a washcloth and upon return, pt had consumed almost half of the container of ice cream. Pt declined trials of mech soft (spaghetti and green beans on her plate), but did take some puree. Pt requested Jello. Suggest downgrade to D2/chopped given limited jaw opening observed and continue standard aspiration precautions. SLP will follow during acute setting. If she discharges soon, recommend [facility] SLP to follow up for acute changes.   HPI HPI: 46 year old female with a history of multiple sclerosis, chronic headache, and depression presented with acute onset of left facial numbness, left facial droop, and left upper extremity and lower extremity weakness that began around 9 AM on 09/08/2016 with symptom progression throughout the day. The patient woke up around 8 AM on 09/08/2016 with a headache and subsequently developed the above symptoms around 9 AM with progression throughout the day. The patient also complains of increasing lower back pain that began  around the same time. She denies any bowel or bladder incontinence. She was able to ambulate, but had difficulty secondary to pain. She is able to bear weight on her legs. She denies any fevers, chills, nausea, vomiting, diarrhea, dysuria, hematuria. She does complain of chest discomfort that began 09/08/2016. without any shortness of breath, dizziness, nausea. There are no alleviating or exacerbating factors regarding her chest pain. She has been taking approximately 6-8 pills of Aleve on a weekly basis for her back pain and headaches. Neurology was contacted in the emergency department and recommended admission for possible multiple sclerosis exacerbation. MRI: Unchanged cerebral white matter lesions consistent with history      SLP Plan  Continue with current plan of care     Recommendations  Diet recommendations: Dysphagia 2 (fine chop);Thin liquid Liquids provided via: Straw Medication Administration: Crushed with puree Supervision: Patient able to self feed;Intermittent supervision to cue for compensatory strategies Compensations: Lingual sweep for clearance of pocketing Postural Changes and/or Swallow Maneuvers: Seated upright 90 degrees;Upright 30-60 min after meal                Oral Care Recommendations: Staff/trained caregiver to provide oral care Follow up Recommendations: Home health SLP Plan: Continue with current plan of care       Thank you,  Havery Moros, CCC-SLP [phone]                 Stokely Jeancharles 09/10/2016, 3:36 PM

## 2016-09-10 NOTE — Progress Notes (Signed)
PROGRESS NOTE  [name] ZOX:[id number] DOB: Oct 02, 1970 DOA: 09/08/2016 PCP: Maryelizabeth Rowan, MD  Brief History:  Unchanged cerebral white matter lesions consistent with history of multiple sclerosis. No evidence of acute demyelination.  46 year old female with a history of multiple sclerosis, chronic headache, and depression presented with acute onset of left facial numbness, left facial droop, and left upper extremity and lower extremity weakness that began around 9 AM on 09/08/2016 with symptom progression throughout the day. The patient woke up around 8 AM on 09/08/2016 with a headache and subsequently developed the above symptoms around 9 AM with progression throughout the day. The patient also complains of increasing lower back pain that began around the same time. She denies any bowel or bladder incontinence. She was able to ambulate, but had difficulty secondary to pain. She is able to bear weight on her legs. She denies any fevers, chills, nausea, vomiting, diarrhea, dysuria, hematuria. She does complain of chest discomfort that began 09/08/2016. without any shortness of breath, dizziness, nausea. There are no alleviating or exacerbating factors regarding her chest pain. She has been taking approximately 6-8 pills of Aleve on a weekly basis for her back pain and headaches. Neurology was contacted in the emergency department and recommended admission for possible multiple sclerosis exacerbation.  Assessment/Plan: Multiple sclerosis exacerbation? -Continue IV Solu-Medrol per neurology -appreciate Dr. Gerilyn Pilgrim consult -PT eval-->HHPT -09/09/16--pt has some nonphysiologic components on exam--able to speak fluently, but "unable" to open mouth when asked on exam -MRI brain--Unchanged cerebral white matter lesions consistent with history of multiple sclerosis. No evidence of acute demyelination. No acute findings -endorsed compliance with Tecfidera  Leg weakness and low  back pain -09/10/16--MRI C and T-Spine--neg for acute findings -09/10/16-MRI L-spine--chronic bilateral pars defects with anterolithesis, no compressive stenosis -07/01/2015 L-spine MRI--severe L5-S1 disc degeneration with mild bilateral neural foraminal stenosis -09/09/16--has functional/nonphysiologic exam--she was able to lift her left arm and leg to sit up in bed and while removing socks, but able to lift on formal exam; +Hoover sign -UA--no pyuria -UDS--neg -B12--398 -TSH--1.399 -CK--52  Hyperglycemia -likely due to steroids -ISS -check A1C--5.6  Chronic headache -suspect a component of analgesic headace -MRI as above  Atypical chest pain -EKG--sinus rhythm, nonspecfic T-wave changes -cycle troponins--neg x 3   Disposition Plan:   Home when cleared by neurology Family Communication:   Spouse updated at bedside 2/6  Consultants:  neurology  Code Status:  FULL  DVT Prophylaxis:  [place] Heparin    Procedures: As Listed in Progress Note Above  Antibiotics: None    Subjective: Patient states that she is feeling stronger on her left side. She states that she is able to move her lips today. Denies any fever, chills, chest pain, tightness breath, nausea, vomiting, abdominal pain, dysuria.  Objective: Vitals:   09/09/16 1545 09/09/16 2105 09/10/16 0418 09/10/16 1417  BP: 124/63 111/69 103/66 116/68  Pulse: 80 85 78 84  Resp: 20 20 20 20   Temp: 99.6 F (37.6 C) 99.5 F (37.5 C) 98.6 F (37 C) 98.4 F (36.9 C)  TempSrc: Oral Oral Oral Oral  SpO2: 97% 96% 99% 98%  Weight:      Height:        Intake/Output Summary (Last 24 hours) at 09/10/16 1600 Last data filed at 09/10/16 0300  Gross per 24 hour  Intake             1008 ml  Output  0 ml  Net             1008 ml   Weight change:  Exam:   General:  Pt is alert, follows commands appropriately, not in acute distress  HEENT: No icterus, No thrush, No neck mass, [place]/AT  Cardiovascular: RRR,  S1/S2, no rubs, no gallops  Respiratory: CTA bilaterally, no wheezing, no crackles, no rhonchi  Abdomen: Soft/+BS, non tender, non distended, no guarding  Extremities: No edema, No lymphangitis, No petechiae, No rashes, no synovitis   Data Reviewed: I have personally reviewed following labs and imaging studies Basic Metabolic Panel:  Recent Labs Lab 09/08/16 1935 09/09/16 0545 09/09/16 0947 09/10/16 0610  NA 138 137  --  140  K 3.4* 4.0  --  3.7  CL 108 108  --  112*  CO2 23 18*  --  21*  GLUCOSE 120* 202*  --  157*  BUN 18 15  --  25*  CREATININE 0.56 0.62  --  0.56  CALCIUM 9.0 8.9  --  8.5*  MG  --   --  1.9 2.2   Liver Function Tests:  Recent Labs Lab 09/08/16 1935 09/09/16 0545  AST 22 28  ALT 15 16  ALKPHOS 75 76  BILITOT 0.2* 0.3  PROT 6.9 7.1  ALBUMIN 3.7 3.5   No results for input(s): LIPASE, AMYLASE in the last 168 hours. No results for input(s): AMMONIA in the last 168 hours. Coagulation Profile:  Recent Labs Lab 09/08/16 1933  INR 0.90   CBC:  Recent Labs Lab 09/08/16 1900 09/09/16 0545  WBC 7.0 4.4  NEUTROABS 3.9  --   HGB 14.2 14.3  HCT 41.3 42.2  MCV 89.4 89.6  PLT 330 347   Cardiac Enzymes:  Recent Labs Lab 09/09/16 0947 09/09/16 1446 09/09/16 2058  CKTOTAL 52  --   --   TROPONINI <0.03 <0.03 <0.03   BNP: Invalid input(s): POCBNP CBG:  Recent Labs Lab 09/09/16 2002 09/10/16 0015 09/10/16 0416 09/10/16 0802 09/10/16 1126  GLUCAP 119* 136* 147* 166* 153*   HbA1C:  Recent Labs  09/09/16 0947  HGBA1C 5.6   Urine analysis:    Component Value Date/Time   COLORURINE YELLOW 09/09/2016 0902   APPEARANCEUR TURBID (A) 09/09/2016 0902   LABSPEC >1.030 (H) 09/09/2016 0902   PHURINE 5.5 09/09/2016 0902   GLUCOSEU 250 (A) 09/09/2016 0902   GLUCOSEU NEGATIVE 06/20/2015 1050   HGBUR NEGATIVE 09/09/2016 0902   BILIRUBINUR NEGATIVE 09/09/2016 0902   KETONESUR TRACE (A) 09/09/2016 0902   PROTEINUR NEGATIVE  09/09/2016 0902   UROBILINOGEN 0.2 06/20/2015 1050   NITRITE NEGATIVE 09/09/2016 0902   LEUKOCYTESUR NEGATIVE 09/09/2016 0902   Sepsis Labs: @LABRCNTIP (procalcitonin:4,lacticidven:4) )No results found for this or any previous visit (from the past 240 hour(s)).   Scheduled Meds: . Dimethyl Fumarate  240 mg Oral Daily  . famotidine (PEPCID) IV  20 mg Intravenous Q12H  . heparin  5,000 Units Subcutaneous Q8H  . insulin aspart  0-15 Units Subcutaneous TID WC  . insulin aspart  0-5 Units Subcutaneous QHS  . methylPREDNISolone (SOLU-MEDROL) injection  1,000 mg Intravenous Daily  . sodium chloride flush  3 mL Intravenous Q12H   Continuous Infusions: . sodium chloride 75 mL/hr at 09/09/16 2248    Procedures/Studies: Ct Head Wo Contrast  Result Date: 09/08/2016 CLINICAL DATA:  Left-sided weakness EXAM: CT HEAD WITHOUT CONTRAST TECHNIQUE: Contiguous axial images were obtained from the base of the skull through the vertex without intravenous contrast. COMPARISON:  Brain MRI 07/01/2015 FINDINGS: Brain: No evidence of acute infarction, hemorrhage, hydrocephalus, extra-axial collection or mass effect. White matter disease that is underestimated relative to MRI in this patient with history of multiple sclerosis. 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 Vascular: Atherosclerotic calcifications seen at the left vertebral. No hyperdense vessel. Skull: Negative Sinuses/Orbits: No acute finding IMPRESSION: 1. No acute finding. 2. 3 mm colloid cyst.  No hydrocephalus. Electronically Signed   By: Marnee Spring M.D.   On: 09/08/2016 19:43   Mr Laqueta Jean ZO Contrast  Result Date: 09/09/2016 CLINICAL DATA:  Headache followed by facial numbness and left facial and left arm weakness. History of multiple sclerosis. EXAM: MRI HEAD WITHOUT AND WITH CONTRAST TECHNIQUE: Multiplanar, multiecho pulse sequences of the brain and surrounding structures were obtained without and with intravenous  contrast. CONTRAST:  16mL MULTIHANCE GADOBENATE DIMEGLUMINE 529 MG/ML IV SOLN COMPARISON:  Head CT 09/08/2016 and MRI 07/01/2015 FINDINGS: Brain: There is no evidence of acute infarct intracranial hemorrhage, midline shift, or extra-axial fluid collection. 3 mm intermediate T1 signal intensity focus at the level of the foramen of [place]ro is compatible with a colloid cyst as seen on CT. The ventricles are normal in size without evidence of hydrocephalus. Small foci of T2 hyperintensity are again seen scattered throughout the cerebral white matter bilaterally. These were better demonstrated on the prior MRI due to differences in technique and field strength but have likely not significantly changed. No new or enhancing lesions are identified. No brainstem or cerebellar lesions are seen. Vascular: Major intracranial vascular flow voids are preserved. Skull and upper cervical spine: Unremarkable bone marrow signal. Sinuses/Orbits: Unremarkable orbits.  Clear sinuses. Other: None. IMPRESSION: 1. No acute intracranial abnormality. 2. Unchanged cerebral white matter lesions consistent with history of multiple sclerosis. No evidence of acute demyelination. 3. 3 mm colloid cyst. Electronically Signed   By: Sebastian Ache M.D.   On: 09/09/2016 08:20   Mr Cervical Spine Wo Contrast  Result Date: 09/10/2016 CLINICAL DATA:  Acute onset of left facial numbness and left facial droop. Progressive symptoms. EXAM: MRI CERVICAL, THORACIC AND LUMBAR SPINE WITHOUT CONTRAST TECHNIQUE: Multiplanar and multiecho pulse sequences of the cervical spine, to include the craniocervical junction and cervicothoracic junction, and thoracic and lumbar spine, were obtained without intravenous contrast. COMPARISON:  Lumbar spine MRI 07/01/2015 FINDINGS: MRI CERVICAL SPINE FINDINGS Alignment: Normal Vertebrae: No fracture, evidence of discitis, or bone lesion. Cord: Normal signal and volume. Posterior Fossa, vertebral arteries, paraspinal tissues:  Negative Disc levels: No significant degenerative change.  No impingement. MRI THORACIC SPINE FINDINGS Alignment:  Mildly exaggerated thoracic kyphosis. Vertebrae: No fracture, evidence of discitis, or bone lesion. Cord:  Normal signal and morphology. Paraspinal and other soft tissues: Negative. Disc levels: No impingement or significant degenerative change. MRI LUMBAR SPINE FINDINGS Segmentation: 5 lumbar type vertebral bodies based on the lowest visible ribs. Alignment:  Grade 1 anterolisthesis at L5-S1, chronic Vertebrae:  No fracture, evidence of discitis, or bone lesion. Conus medullaris: Extends to the T12-L1 level and appears normal. Paraspinal and other soft tissues: Negative Disc levels: T12- L1: Unremarkable. L1-L2: Unremarkable. L2-L3: Mild disc desiccation without narrowing or herniation. L3-L4: Early facet spurring.  No impingement L4-L5: Mild disc desiccation with tiny posterior annular fissure. Minor facet spurring on the right. No impingement L5-S1:Chronic pars defects bilaterally with grade 1 anterolisthesis and focal disc narrowing. The foramina are narrowed but there is no L5 compression. IMPRESSION: 1. Normal appearance of the cervical and thoracic  cord. No evidence of demyelination. 2. 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 No compressive stenosis. Electronically Signed   By: Marnee Spring M.D.   On: 09/10/2016 08:21   Mr Thoracic Spine Wo Contrast  Result Date: 09/10/2016 CLINICAL DATA:  Acute onset of left facial numbness and left facial droop. Progressive symptoms. EXAM: MRI CERVICAL, THORACIC AND LUMBAR SPINE WITHOUT CONTRAST TECHNIQUE: Multiplanar and multiecho pulse sequences of the cervical spine, to include the craniocervical junction and cervicothoracic junction, and thoracic and lumbar spine, were obtained without intravenous contrast. COMPARISON:  Lumbar spine MRI 07/01/2015 FINDINGS: MRI CERVICAL SPINE FINDINGS Alignment: Normal  Vertebrae: No fracture, evidence of discitis, or bone lesion. Cord: Normal signal and volume. Posterior Fossa, vertebral arteries, paraspinal tissues: Negative Disc levels: No significant degenerative change.  No impingement. MRI THORACIC SPINE FINDINGS Alignment:  Mildly exaggerated thoracic kyphosis. Vertebrae: No fracture, evidence of discitis, or bone lesion. Cord:  Normal signal and morphology. Paraspinal and other soft tissues: Negative. Disc levels: No impingement or significant degenerative change. MRI LUMBAR SPINE FINDINGS Segmentation: 5 lumbar type vertebral bodies based on the lowest visible ribs. Alignment:  Grade 1 anterolisthesis at L5-S1, chronic Vertebrae:  No fracture, evidence of discitis, or bone lesion. Conus medullaris: Extends to the T12-L1 level and appears normal. Paraspinal and other soft tissues: Negative Disc levels: T12- L1: Unremarkable. L1-L2: Unremarkable. L2-L3: Mild disc desiccation without narrowing or herniation. L3-L4: Early facet spurring.  No impingement L4-L5: Mild disc desiccation with tiny posterior annular fissure. Minor facet spurring on the right. No impingement L5-S1:Chronic pars defects bilaterally with grade 1 anterolisthesis and focal disc narrowing. The foramina are narrowed but there is no L5 compression. IMPRESSION: 1. Normal appearance of the cervical and thoracic cord. No evidence of demyelination. 2. 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 No compressive stenosis. Electronically Signed   By: Marnee Spring M.D.   On: 09/10/2016 08:21   Mr Lumbar Spine Wo Contrast  Result Date: 09/10/2016 CLINICAL DATA:  Acute onset of left facial numbness and left facial droop. Progressive symptoms. EXAM: MRI CERVICAL, THORACIC AND LUMBAR SPINE WITHOUT CONTRAST TECHNIQUE: Multiplanar and multiecho pulse sequences of the cervical spine, to include the craniocervical junction and cervicothoracic junction, and thoracic and lumbar  spine, were obtained without intravenous contrast. COMPARISON:  Lumbar spine MRI 07/01/2015 FINDINGS: MRI CERVICAL SPINE FINDINGS Alignment: Normal Vertebrae: No fracture, evidence of discitis, or bone lesion. Cord: Normal signal and volume. Posterior Fossa, vertebral arteries, paraspinal tissues: Negative Disc levels: No significant degenerative change.  No impingement. MRI THORACIC SPINE FINDINGS Alignment:  Mildly exaggerated thoracic kyphosis. Vertebrae: No fracture, evidence of discitis, or bone lesion. Cord:  Normal signal and morphology. Paraspinal and other soft tissues: Negative. Disc levels: No impingement or significant degenerative change. MRI LUMBAR SPINE FINDINGS Segmentation: 5 lumbar type vertebral bodies based on the lowest visible ribs. Alignment:  Grade 1 anterolisthesis at L5-S1, chronic Vertebrae:  No fracture, evidence of discitis, or bone lesion. Conus medullaris: Extends to the T12-L1 level and appears normal. Paraspinal and other soft tissues: Negative Disc levels: T12- L1: Unremarkable. L1-L2: Unremarkable. L2-L3: Mild disc desiccation without narrowing or herniation. L3-L4: Early facet spurring.  No impingement L4-L5: Mild disc desiccation with tiny posterior annular fissure. Minor facet spurring on the right. No impingement L5-S1:Chronic pars defects bilaterally with grade 1 anterolisthesis and focal disc narrowing. The foramina are narrowed but there is no L5 compression. IMPRESSION: 1. Normal appearance of the cervical and thoracic  cord. No evidence of demyelination. 2. 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 No compressive stenosis. Electronically Signed   By: Marnee Spring M.D.   On: 09/10/2016 08:21    Rebekah Thrall, DO  [facility] Pager [phone]  If 7PM-7AM, please contact night-coverage [email or web address] Password [email or web address] 09/10/2016, 4:00 PM   LOS: 2 days

## 2016-09-11 DIAGNOSIS — G8194 Hemiplegia, unspecified affecting left nondominant side: Secondary | ICD-10-CM

## 2016-09-11 DIAGNOSIS — R262 Difficulty in walking, not elsewhere classified: Secondary | ICD-10-CM

## 2016-09-11 DIAGNOSIS — R531 Weakness: Secondary | ICD-10-CM

## 2016-09-11 DIAGNOSIS — G35 Multiple sclerosis: Principal | ICD-10-CM

## 2016-09-11 DIAGNOSIS — R51 Headache: Secondary | ICD-10-CM

## 2016-09-11 DIAGNOSIS — R0789 Other chest pain: Secondary | ICD-10-CM

## 2016-09-11 LAB — BASIC METABOLIC PANEL
Anion gap: 11 (ref 5–15)
BUN: 25 mg/dL — ABNORMAL HIGH (ref 6–20)
CO2: 20 mmol/L — ABNORMAL LOW (ref 22–32)
Calcium: 8.5 mg/dL — ABNORMAL LOW (ref 8.9–10.3)
Chloride: 107 mmol/L (ref 101–111)
Creatinine, Ser: 0.54 mg/dL (ref 0.44–1.00)
GFR calc Af Amer: >60 mL/min (ref >60)
GFR calc non Af Amer: >60 mL/min (ref >60)
Glucose, Bld: 151 mg/dL — ABNORMAL HIGH (ref 65–99)
Potassium: 3.9 mmol/L (ref 3.5–5.1)
Sodium: 138 mmol/L (ref 135–145)

## 2016-09-11 LAB — GLUCOSE, CAPILLARY
Glucose-Capillary: 166 mg/dL — ABNORMAL HIGH (ref 65–99)
Glucose-Capillary: 165 mg/dL — ABNORMAL HIGH (ref 65–99)
Glucose-Capillary: 149 mg/dL — ABNORMAL HIGH (ref 65–99)

## 2016-09-11 LAB — URINE CULTURE

## 2016-09-11 NOTE — Discharge Instructions (Signed)
Esclerosis mltiple (Multiple Sclerosis) La esclerosis mltiple es una enfermedad del sistema nervioso central. Provoca la prdida de la capa aislante de los nervios (vaina de mielina) del cerebro. Cuando esto ocurre, las seales que emite el cerebro no se envan de manera correcta o se interrumpen totalmente. La edad de aparicin de la esclerosis mltiple vara. CAUSAS La causa de la esclerosis mltiple se desconoce. No obstante, es ms frecuente en el norte que en el sur de [place]. FACTORES DE RIESGO Hay una nmero ms alto de mujeres con esclerosis mltiple que hombres. La esclerosis mltiple no es una enfermedad que le transmiten los miembros de su familia (hereditaria). Sin embargo, el riesgo de esclerosis mltiple es ms alto si tiene Media planner con esa enfermedad. SIGNOS Y SNTOMAS Los sntomas de la esclerosis mltiple ocurren en episodios o ataques. Estos ataques pueden durar desde semanas a meses. Puede haber largos perodos [employer] ataques en los que casi no hay sntomas. Los sntomas de la esclerosis mltiple varan debido a las Pensions consultant en que afecta al sistema nervioso central. Los sntomas principales de la esclerosis mltiple incluyen los siguientes:  Trastornos visuales y Engineer, mining en los ojos.  Entumecimiento.  Debilidad.  Incapacidad de [employer], las manos, los pies o las piernas (parlisis).  Trastornos del equilibrio  Temblores. DIAGNSTICO El mdico puede diagnosticar la esclerosis mltiple con la ayuda de estudios de diagnstico por imgenes y anlisis de laboratorio. Estos pueden incluir radiografas especializadas y estudios del lquido cefalorraqudeo. El mejor estudio de diagnstico por imgenes para Astronomer esclerosis mltiple es la resonancia magntica (RM). TRATAMIENTO No existe una cura para la esclerosis mltiple, pero hay medicamentos que pueden disminuir la cantidad y la frecuencia de los ataques. Los corticoides se usan  frecuentemente para obtener un alivio a Product manager. La fisioterapia y la terapia ocupacional tambin pueden ayudar. Tambin existen muchos tratamientos alternativos o complementarios nuevos para ayudar a Chief Operating Officer los sntomas de la esclerosis mltiple. Pregunte a su mdico si alguna de estas otras opciones es [place] para usted. INSTRUCCIONES PARA EL CUIDADO EN EL HOGAR  Tome los medicamentos segn las indicaciones del mdico.  Haga ejercicio segn las indicaciones del mdico. SOLICITE ATENCIN MDICA SI: Comienza a sentirse deprimido. SOLICITE ATENCIN MDICA DE INMEDIATO SI:  Presenta parlisis.  Tiene problemas para orinar, ir de cuerpo o con el funcionamiento sexual.  Presenta cambios en el estado mental, como olvidos o variaciones del [place] de nimo.  Tiene un perodo de movimientos incontrolables (convulsin) Esta informacin no tiene Theme park manager el consejo del mdico. Asegrese de hacerle al mdico cualquier pregunta que tenga. Document Released: 05/01/2005 Document Revised: 07/27/2013 Document Reviewed: 03/29/2013 Elsevier Interactive Patient Education  2017 [employer].

## 2016-09-11 NOTE — Care Management Note (Signed)
Case Management Note  Patient Details  Name: [name] MRN: [id number] Date of Birth: 1971/07/16   Expected Discharge Date:   09/11/2016               Expected Discharge Plan:  Home/Self Care  In-House Referral:  NA  Discharge planning Services  CM Consult  Post Acute Care Choice:  Durable Medical Equipment Choice offered to:  Spouse, Patient  DME Arranged:  3-N-1, Wheelchair manual DME Agency[facility]    [facility]  Status of Service:  Completed, signed off  Additional Comments: Pt anticipating DC home today. Pt has chosen to complete PT OP. Referral sent to [facility], pt's choice of facility. Pt's WC and 3 in 1 has been delivered to room. Pt's husband at bedside.  Malcolm Metro, RN 09/11/2016, 2:21 PM

## 2016-09-11 NOTE — Discharge Summary (Signed)
Physician Discharge Summary  Ilyanna Baillargeon Rangel ZOX:[id number] DOB: 1971/07/31 DOA: 09/08/2016  PCP: Maryelizabeth Rowan, MD  Admit date: 09/08/2016 Discharge date: 09/11/2016  Admitted From: Home) Disposition:  Home  Recommendations for Outpatient Follow-up:  1. Follow up with PCP in 1-2 weeks 2. Schedule follow up with your neurologist in 2 weeks 3. Please obtain BMP/CBC in one week 4. Discuss medication and recent hospitalization with your neurologist  Home Health: No Equipment/Devices: 3 in 1, manual wheelchair  Discharge Condition: Stable CODE STATUS:Full Code Diet recommendation: as tolerated  Brief/Interim Summary: 46 year old female with a history of multiple sclerosis, chronic headache, and depression presented with acute onset of left facial numbness, left facial droop, and left upper extremity and lower extremity weakness that began around 9 AM on 09/08/2016 with symptom progression throughout the day. The patient woke up around 8 AM on 09/08/2016 with a headache and subsequently developed the above symptoms around 9 AM with progression throughout the day. The patient also complains of increasing lower back pain that began around the same time. She denies any bowel or bladder incontinence. She was able to ambulate, but had difficulty secondary to pain. She is able to bear weight on her legs. She denies any fevers, chills, nausea, vomiting, diarrhea, dysuria, hematuria. She does complain of chest discomfort that began 09/08/2016. without any shortness of breath, dizziness, nausea. There are no alleviating or exacerbating factors regarding her chest pain. She has been taking approximately 6-8 pills of Aleve on a weekly basis for her back pain and headaches. Neurology was contacted in the emergency department and recommended admission for possible multiple sclerosis exacerbation.  She was given 3 days of IV Solumedrol and patient improved per her husband.  She was interested in pursing  outpatient PT and was set up with OP PT at [facility].  She has a neurologist in [place] that patient and her husband were encouraged to schedule a follow up appointment within 2 weeks.  Discharge Diagnoses:  Principal Problem:   Relapsing remitting multiple sclerosis (HCC) Active Problems:   Multiple sclerosis (HCC)   Frequent headaches   Multiple sclerosis exacerbation (HCC)   Atypical chest pain   Hyperglycemia   Left-sided weakness   Difficulty in walking, not elsewhere classified   Hemiparesis, left [facility])    Discharge Instructions  Discharge Instructions    Ambulatory referral to Physical Therapy    Complete by:  As directed    Call MD for:  difficulty breathing, headache or visual disturbances    Complete by:  As directed    Call MD for:  extreme fatigue    Complete by:  As directed    Call MD for:  hives    Complete by:  As directed    Call MD for:  persistant dizziness or light-headedness    Complete by:  As directed    Call MD for:  persistant nausea and vomiting    Complete by:  As directed    Call MD for:  severe uncontrolled pain    Complete by:  As directed    Call MD for:  temperature >100.4    Complete by:  As directed    Diet - low sodium heart healthy    Complete by:  As directed    Increase activity slowly    Complete by:  As directed      Allergies as of 09/11/2016   No Known Allergies     Medication List    TAKE these medications   aspirin  EC 325 MG tablet Take 325-650 mg by mouth daily as needed for mild pain.   NEXPLANON 68 MG Impl implant Generic drug:  etonogestrel 1 each by Subdermal route once.   TECFIDERA 240 MG Cpdr Generic drug:  Dimethyl Fumarate Take 240 mg by mouth daily.            Durable Medical Equipment        Start     Ordered   09/11/16 1604  DME 3-in-1  Once     09/11/16 1604   09/11/16 1604  For home use only DME standard manual wheelchair with seat cushion  (Wheelchairs)  Once    Comments:  Patient suffers  from multiple sclerosis which impairs their ability to perform daily activities like bathing, dressing, feeding, grooming and toileting in the home.  A walker will not resolve  issue with performing activities of daily living. A wheelchair will allow patient to safely perform daily activities. Patient can safely propel the wheelchair in the home or has a caregiver who can provide assistance.  Accessories: elevating leg rests (ELRs), wheel locks, extensions and anti-tippers.   09/11/16 1604   09/10/16 1211  For home use only DME 3 n 1  Once     09/10/16 1210   09/10/16 1208  For home use only DME standard manual wheelchair with seat cushion  Once    Comments:  Patient suffers from multiple sclerosis which impairs their ability to perform daily activities like ambulating in the home.  A walker will not resolve  issue with performing activities of daily living. A wheelchair will allow patient to safely perform daily activities. Patient can safely propel the wheelchair in the home or has a caregiver who can provide assistance.  Accessories: elevating leg rests (ELRs), wheel locks, extensions and anti-tippers.   09/10/16 1209      No Known Allergies  Consultations:  Neurology  PT   Procedures/Studies: Ct Head Wo Contrast  Result Date: 09/08/2016 CLINICAL DATA:  Left-sided weakness EXAM: CT HEAD WITHOUT CONTRAST TECHNIQUE: Contiguous axial images were obtained from the base of the skull through the vertex without intravenous contrast. COMPARISON:  Brain MRI 07/01/2015 FINDINGS: Brain: No evidence of acute infarction, hemorrhage, hydrocephalus, extra-axial collection or mass effect. White matter disease that is underestimated relative to MRI in this patient with history of multiple sclerosis. 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 Vascular: Atherosclerotic calcifications seen at the left vertebral. No hyperdense vessel. Skull: Negative Sinuses/Orbits: No acute finding  IMPRESSION: 1. No acute finding. 2. 3 mm colloid cyst.  No hydrocephalus. Electronically Signed   By: Marnee Spring M.D.   On: 09/08/2016 19:43   Mr Laqueta Jean ZO Contrast  Result Date: 09/09/2016 CLINICAL DATA:  Headache followed by facial numbness and left facial and left arm weakness. History of multiple sclerosis. EXAM: MRI HEAD WITHOUT AND WITH CONTRAST TECHNIQUE: Multiplanar, multiecho pulse sequences of the brain and surrounding structures were obtained without and with intravenous contrast. CONTRAST:  20mL MULTIHANCE GADOBENATE DIMEGLUMINE 529 MG/ML IV SOLN COMPARISON:  Head CT 09/08/2016 and MRI 07/01/2015 FINDINGS: Brain: There is no evidence of acute infarct intracranial hemorrhage, midline shift, or extra-axial fluid collection. 3 mm intermediate T1 signal intensity focus at the level of the foramen of Monro is compatible with a colloid cyst as seen on CT. The ventricles are normal in size without evidence of hydrocephalus. Small foci of T2 hyperintensity are again seen scattered throughout the cerebral white matter bilaterally.  These were better demonstrated on the prior MRI due to differences in technique and field strength but have likely not significantly changed. No new or enhancing lesions are identified. No brainstem or cerebellar lesions are seen. Vascular: Major intracranial vascular flow voids are preserved. Skull and upper cervical spine: Unremarkable bone marrow signal. Sinuses/Orbits: Unremarkable orbits.  Clear sinuses. Other: None. IMPRESSION: 1. No acute intracranial abnormality. 2. Unchanged cerebral white matter lesions consistent with history of multiple sclerosis. No evidence of acute demyelination. 3. 3 mm colloid cyst. Electronically Signed   By: Sebastian Ache M.D.   On: 09/09/2016 08:20   Mr Cervical Spine Wo Contrast  Result Date: 09/10/2016 CLINICAL DATA:  Acute onset of left facial numbness and left facial droop. Progressive symptoms. EXAM: MRI CERVICAL, THORACIC AND LUMBAR  SPINE WITHOUT CONTRAST TECHNIQUE: Multiplanar and multiecho pulse sequences of the cervical spine, to include the craniocervical junction and cervicothoracic junction, and thoracic and lumbar spine, were obtained without intravenous contrast. COMPARISON:  Lumbar spine MRI 07/01/2015 FINDINGS: MRI CERVICAL SPINE FINDINGS Alignment: Normal Vertebrae: No fracture, evidence of discitis, or bone lesion. Cord: Normal signal and volume. Posterior Fossa, vertebral arteries, paraspinal tissues: Negative Disc levels: No significant degenerative change.  No impingement. MRI THORACIC SPINE FINDINGS Alignment:  Mildly exaggerated thoracic kyphosis. Vertebrae: No fracture, evidence of discitis, or bone lesion. Cord:  Normal signal and morphology. Paraspinal and other soft tissues: Negative. Disc levels: No impingement or significant degenerative change. MRI LUMBAR SPINE FINDINGS Segmentation: 5 lumbar type vertebral bodies based on the lowest visible ribs. Alignment:  Grade 1 anterolisthesis at L5-S1, chronic Vertebrae:  No fracture, evidence of discitis, or bone lesion. Conus medullaris: Extends to the T12-L1 level and appears normal. Paraspinal and other soft tissues: Negative Disc levels: T12- L1: Unremarkable. L1-L2: Unremarkable. L2-L3: Mild disc desiccation without narrowing or herniation. L3-L4: Early facet spurring.  No impingement L4-L5: Mild disc desiccation with tiny posterior annular fissure. Minor facet spurring on the right. No impingement L5-S1:Chronic pars defects bilaterally with grade 1 anterolisthesis and focal disc narrowing. The foramina are narrowed but there is no L5 compression. IMPRESSION: 1. Normal appearance of the cervical and thoracic cord. No evidence of demyelination. 2. 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 No compressive stenosis. Electronically Signed   By: Marnee Spring M.D.   On: 09/10/2016 08:21   Mr Thoracic Spine Wo Contrast  Result Date:  09/10/2016 CLINICAL DATA:  Acute onset of left facial numbness and left facial droop. Progressive symptoms. EXAM: MRI CERVICAL, THORACIC AND LUMBAR SPINE WITHOUT CONTRAST TECHNIQUE: Multiplanar and multiecho pulse sequences of the cervical spine, to include the craniocervical junction and cervicothoracic junction, and thoracic and lumbar spine, were obtained without intravenous contrast. COMPARISON:  Lumbar spine MRI 07/01/2015 FINDINGS: MRI CERVICAL SPINE FINDINGS Alignment: Normal Vertebrae: No fracture, evidence of discitis, or bone lesion. Cord: Normal signal and volume. Posterior Fossa, vertebral arteries, paraspinal tissues: Negative Disc levels: No significant degenerative change.  No impingement. MRI THORACIC SPINE FINDINGS Alignment:  Mildly exaggerated thoracic kyphosis. Vertebrae: No fracture, evidence of discitis, or bone lesion. Cord:  Normal signal and morphology. Paraspinal and other soft tissues: Negative. Disc levels: No impingement or significant degenerative change. MRI LUMBAR SPINE FINDINGS Segmentation: 5 lumbar type vertebral bodies based on the lowest visible ribs. Alignment:  Grade 1 anterolisthesis at L5-S1, chronic Vertebrae:  No fracture, evidence of discitis, or bone lesion. Conus medullaris: Extends to the T12-L1 level and appears normal. Paraspinal and other soft tissues: Negative Disc  levels: T12- L1: Unremarkable. L1-L2: Unremarkable. L2-L3: Mild disc desiccation without narrowing or herniation. L3-L4: Early facet spurring.  No impingement L4-L5: Mild disc desiccation with tiny posterior annular fissure. Minor facet spurring on the right. No impingement L5-S1:Chronic pars defects bilaterally with grade 1 anterolisthesis and focal disc narrowing. The foramina are narrowed but there is no L5 compression. IMPRESSION: 1. Normal appearance of the cervical and thoracic cord. No evidence of demyelination. 2. 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  No compressive stenosis. Electronically Signed   By: Marnee Spring M.D.   On: 09/10/2016 08:21   Mr Lumbar Spine Wo Contrast  Result Date: 09/10/2016 CLINICAL DATA:  Acute onset of left facial numbness and left facial droop. Progressive symptoms. EXAM: MRI CERVICAL, THORACIC AND LUMBAR SPINE WITHOUT CONTRAST TECHNIQUE: Multiplanar and multiecho pulse sequences of the cervical spine, to include the craniocervical junction and cervicothoracic junction, and thoracic and lumbar spine, were obtained without intravenous contrast. COMPARISON:  Lumbar spine MRI 07/01/2015 FINDINGS: MRI CERVICAL SPINE FINDINGS Alignment: Normal Vertebrae: No fracture, evidence of discitis, or bone lesion. Cord: Normal signal and volume. Posterior Fossa, vertebral arteries, paraspinal tissues: Negative Disc levels: No significant degenerative change.  No impingement. MRI THORACIC SPINE FINDINGS Alignment:  Mildly exaggerated thoracic kyphosis. Vertebrae: No fracture, evidence of discitis, or bone lesion. Cord:  Normal signal and morphology. Paraspinal and other soft tissues: Negative. Disc levels: No impingement or significant degenerative change. MRI LUMBAR SPINE FINDINGS Segmentation: 5 lumbar type vertebral bodies based on the lowest visible ribs. Alignment:  Grade 1 anterolisthesis at L5-S1, chronic Vertebrae:  No fracture, evidence of discitis, or bone lesion. Conus medullaris: Extends to the T12-L1 level and appears normal. Paraspinal and other soft tissues: Negative Disc levels: T12- L1: Unremarkable. L1-L2: Unremarkable. L2-L3: Mild disc desiccation without narrowing or herniation. L3-L4: Early facet spurring.  No impingement L4-L5: Mild disc desiccation with tiny posterior annular fissure. Minor facet spurring on the right. No impingement L5-S1:Chronic pars defects bilaterally with grade 1 anterolisthesis and focal disc narrowing. The foramina are narrowed but there is no L5 compression. IMPRESSION: 1. Normal appearance of the  cervical and thoracic cord. No evidence of demyelination. 2. 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 No compressive stenosis. Electronically Signed   By: Marnee Spring M.D.   On: 09/10/2016 08:21       Subjective: Patient seen by neurology this am and asking if she can go home.  Still having some problems with eating solids but tolerating PO liquids and softs.  Received 3rd dose of steroids last night.   Discharge Exam: Vitals:   09/11/16 0603 09/11/16 1300  BP: 138/75 131/62  Pulse: (!) 58 64  Resp: 18 18  Temp: 98.3 F (36.8 C) 98.6 F (37 C)   Vitals:   09/10/16 1417 09/10/16 2104 09/11/16 0603 09/11/16 1300  BP: 116/68 110/66 138/75 131/62  Pulse: 84 (!) 58 (!) 58 64  Resp: 20 18 18 18   Temp: 98.4 F (36.9 C) 98.2 F (36.8 C) 98.3 F (36.8 C) 98.6 F (37 C)  TempSrc: Oral Oral Oral Oral  SpO2: 98% 97% 99% 93%  Weight:      Height:        General: Pt is alert, awake, not in acute distress Cardiovascular: RRR, S1/S2 +, no rubs, no gallops Respiratory: CTA bilaterally, no wheezing, no rhonchi Abdominal: Soft, NT, ND, bowel sounds + Extremities: no edema, no cyanosis    The results of  significant diagnostics from this hospitalization (including imaging, microbiology, ancillary and laboratory) are listed below for reference.     Microbiology: Recent Results (from the past 240 hour(s))  Urine culture     Status: Abnormal   Collection Time: 09/09/16  9:02 AM  Result Value Ref Range Status   Specimen Description URINE, RANDOM  Final   Special Requests NONE  Final   Culture MULTIPLE SPECIES PRESENT, SUGGEST RECOLLECTION (A)  Final   Report Status 09/11/2016 FINAL  Final     Labs: BNP (last 3 results) No results for input(s): BNP in the last 8760 hours. Basic Metabolic Panel:  Recent Labs Lab 09/08/16 1935 09/09/16 0545 09/09/16 0947 09/10/16 0610 09/11/16 0422  NA 138 137  --  140 138  K 3.4* 4.0  --  3.7  3.9  CL 108 108  --  112* 107  CO2 23 18*  --  21* 20*  GLUCOSE 120* 202*  --  157* 151*  BUN 18 15  --  25* 25*  CREATININE 0.56 0.62  --  0.56 0.54  CALCIUM 9.0 8.9  --  8.5* 8.5*  MG  --   --  1.9 2.2  --    Liver Function Tests:  Recent Labs Lab 09/08/16 1935 09/09/16 0545  AST 22 28  ALT 15 16  ALKPHOS 75 76  BILITOT 0.2* 0.3  PROT 6.9 7.1  ALBUMIN 3.7 3.5   No results for input(s): LIPASE, AMYLASE in the last 168 hours. No results for input(s): AMMONIA in the last 168 hours. CBC:  Recent Labs Lab 09/08/16 1900 09/09/16 0545  WBC 7.0 4.4  NEUTROABS 3.9  --   HGB 14.2 14.3  HCT 41.3 42.2  MCV 89.4 89.6  PLT 330 347   Cardiac Enzymes:  Recent Labs Lab 09/09/16 0947 09/09/16 1446 09/09/16 2058  CKTOTAL 52  --   --   TROPONINI <0.03 <0.03 <0.03   BNP: Invalid input(s): POCBNP CBG:  Recent Labs Lab 09/10/16 1126 09/10/16 1812 09/10/16 2106 09/11/16 0724 09/11/16 1133  GLUCAP 153* 129* 126* 166* 165*   D-Dimer No results for input(s): DDIMER in the last 72 hours. Hgb A1c  Recent Labs  09/09/16 0947  HGBA1C 5.6   Lipid Profile No results for input(s): CHOL, HDL, LDLCALC, TRIG, CHOLHDL, LDLDIRECT in the last 72 hours. Thyroid function studies  Recent Labs  09/08/16 1933  TSH 1.399   Anemia work up  Recent Labs  09/09/16 0947  VITAMINB12 398   Urinalysis    Component Value Date/Time   COLORURINE YELLOW 09/09/2016 0902   APPEARANCEUR TURBID (A) 09/09/2016 0902   LABSPEC >1.030 (H) 09/09/2016 0902   PHURINE 5.5 09/09/2016 0902   GLUCOSEU 250 (A) 09/09/2016 0902   GLUCOSEU NEGATIVE 06/20/2015 1050   HGBUR NEGATIVE 09/09/2016 0902   BILIRUBINUR NEGATIVE 09/09/2016 0902   KETONESUR TRACE (A) 09/09/2016 0902   PROTEINUR NEGATIVE 09/09/2016 0902   UROBILINOGEN 0.2 06/20/2015 1050   NITRITE NEGATIVE 09/09/2016 0902   LEUKOCYTESUR NEGATIVE 09/09/2016 0902   Sepsis Labs Invalid input(s): PROCALCITONIN,  WBC,   LACTICIDVEN Microbiology Recent Results (from the past 240 hour(s))  Urine culture     Status: Abnormal   Collection Time: 09/09/16  9:02 AM  Result Value Ref Range Status   Specimen Description URINE, RANDOM  Final   Special Requests NONE  Final   Culture MULTIPLE SPECIES PRESENT, SUGGEST RECOLLECTION (A)  Final   Report Status 09/11/2016 FINAL  Final     Time  coordinating discharge: 33 minutes  SIGNED:   Katrinka Blazing, MD  [facility] 09/11/2016, 4:04 PM Pager [phone] If 7PM-7AM, please contact night-coverage [email or web address] Password [email or web address]

## 2016-09-11 NOTE — Progress Notes (Signed)
Cisco A. Merlene Laughter, MD     [email or web address]          [name] is an 46 y.o. female.   ASSESSMENT/PLAN:  UPDATE 09-11-16 She seems improved today. We will complete a third dose of Solu-Medrol and she can be discharged. She should follow up with her primary neurologist. Given her relapse on the current disease modifying medication, consideration should be given to switching to another medication with greater efficacy such as tysarbri or ocrevus.   Acute left hemiparesis and dysphagia likely related to relapse of multiple sclerosis. The patient was started on 3 days of IV high dose Solu-Medrol. We may extend his to 5 days depending on how she responds. We are to continue her baseline disease modifying therapy with tefidera. Physical and occupational therapies are recommended. I will do a cervical spine MRI without contrast.   Low back pain due to lumbar disc disease seen on recent lumbar spine MRI.      GENERAL: Doing fair.  HEENT: Supple. Atraumatic normocephalic.   ABDOMEN: soft  EXTREMITIES: No edema   BACK: Normal.  SKIN: Normal by inspection.    MENTAL STATUS: Patient is awake and alert. She does follow commands. There appears to be more marked dysarthria.   CRANIAL NERVES: Pupils are equal, round and reactive to light and accommodation; extra ocular movements are full, there is no significant nystagmus; visual fields are full; upper and lower facial muscles are normal in strength and symmetric, there is no flattening of the nasolabial folds; tongue deviates to the left;   MOTOR: Left upper extremity 3/5 and left leg 4/5. Again, the right side is 5/5.  COORDINATION: Left finger to nose is normal, right finger to nose is normal, No rest tremor; no intention tremor; no postural tremor; no bradykinesia.  REFLEXES: Deep tendon reflexes are symmetrical and normal. Babinski reflexes are flexor bilaterally.   SENSATION: Diminished  sensation involving the left upper extremity and left leg.      The brain MRI is reviewed and shows about 8 deep white matter lesions. The largest involved the posterior left deep white matter areas near to the lateral ventricular system. It is seen on 2-3 cuts. No contrast enhancement is appreciated.    Blood pressure 138/75, pulse (!) 58, temperature 98.3 F (36.8 C), temperature source Oral, resp. rate 18, height _0  (1.575 m), weight 174 lb 6.1 oz (79.1 kg), SpO2 99 %.  Past Medical History:  Diagnosis Date  . Headache   . Multiple sclerosis ([place]) 06/19/2011  . Multiple sclerosis ([place]) 06/19/2011    History reviewed. No pertinent surgical history.  Family History  Problem Relation Age of Onset  . Hypertension Mother     Social History:  reports that she has never smoked. She has never used smokeless tobacco. She reports that she does not drink alcohol or use drugs.  Allergies: No Known Allergies  Medications: Prior to Admission medications   Medication Sig Start Date End Date Taking? Authorizing Provider  aspirin EC 325 MG tablet Take 325-650 mg by mouth daily as needed for mild pain.   Yes Historical Provider, MD  Dimethyl Fumarate (TECFIDERA) 240 MG CPDR Take 240 mg by mouth daily.    Yes Historical Provider, MD  etonogestrel (NEXPLANON) 68 MG IMPL implant 1 each by Subdermal route once.   Yes Historical Provider, MD    Scheduled Meds: . Dimethyl Fumarate  240 mg Oral Daily  . famotidine (PEPCID) IV  20 mg Intravenous  Q12H  . heparin  5,000 Units Subcutaneous Q8H  . insulin aspart  0-15 Units Subcutaneous TID WC  . insulin aspart  0-5 Units Subcutaneous QHS  . sodium chloride flush  3 mL Intravenous Q12H   Continuous Infusions: . sodium chloride 75 mL/hr at 09/09/16 2248   PRN Meds:.aspirin EC, morphine injection, traMADol     Results for orders placed or performed during the hospital encounter of 09/08/16 (from the past 48 hour(s))  Urine rapid drug  screen (hosp performed)     Status: None   Collection Time: 09/09/16  9:02 AM  Result Value Ref Range   Opiates NONE DETECTED NONE DETECTED   Cocaine NONE DETECTED NONE DETECTED   Benzodiazepines NONE DETECTED NONE DETECTED   Amphetamines NONE DETECTED NONE DETECTED   Tetrahydrocannabinol NONE DETECTED NONE DETECTED   Barbiturates NONE DETECTED NONE DETECTED    Comment:        DRUG SCREEN FOR MEDICAL PURPOSES ONLY.  IF CONFIRMATION IS NEEDED FOR ANY PURPOSE, NOTIFY LAB WITHIN 5 DAYS.        LOWEST DETECTABLE LIMITS FOR URINE DRUG SCREEN Drug Class       Cutoff (ng/mL) Amphetamine      1000 Barbiturate      200 Benzodiazepine   338 Tricyclics       250 Opiates          300 Cocaine          300 THC              50   Urine culture     Status: Abnormal   Collection Time: 09/09/16  9:02 AM  Result Value Ref Range   Specimen Description URINE, RANDOM    Special Requests NONE    Culture MULTIPLE SPECIES PRESENT, SUGGEST RECOLLECTION (A)    Report Status 09/11/2016 FINAL   Urinalysis, Routine w reflex microscopic     Status: Abnormal   Collection Time: 09/09/16  9:02 AM  Result Value Ref Range   Color, Urine YELLOW YELLOW   APPearance TURBID (A) CLEAR   Specific Gravity, Urine >1.030 (H) 1.005 - 1.030   pH 5.5 5.0 - 8.0   Glucose, UA 250 (A) NEGATIVE mg/dL   Hgb urine dipstick NEGATIVE NEGATIVE   Bilirubin Urine NEGATIVE NEGATIVE   Ketones, ur TRACE (A) NEGATIVE mg/dL   Protein, ur NEGATIVE NEGATIVE mg/dL   Nitrite NEGATIVE NEGATIVE   Leukocytes, UA NEGATIVE NEGATIVE  Urinalysis, Microscopic (reflex)     Status: Abnormal   Collection Time: 09/09/16  9:02 AM  Result Value Ref Range   RBC / HPF NONE SEEN 0 - 5 RBC/hpf   WBC, UA NONE SEEN 0 - 5 WBC/hpf   Bacteria, UA FEW (A) NONE SEEN   Squamous Epithelial / LPF 0-5 (A) NONE SEEN  Vitamin B12     Status: None   Collection Time: 09/09/16  9:47 AM  Result Value Ref Range   Vitamin B-12 398 180 - 914 pg/mL    Comment:  (NOTE) This assay is not validated for testing neonatal or myeloproliferative syndrome specimens for Vitamin B12 levels. Performed at [facility], [place] [address], [place], [place] [address]   CK     Status: None   Collection Time: 09/09/16  9:47 AM  Result Value Ref Range   Total CK 52 38 - 234 U/L  Hemoglobin A1c     Status: None   Collection Time: 09/09/16  9:47 AM  Result Value Ref Range  Hgb A1c MFr Bld 5.6 4.8 - 5.6 %    Comment: (NOTE)         Pre-diabetes: 5.7 - 6.4         Diabetes: >6.4         Glycemic control for adults with diabetes: <7.0    Mean Plasma Glucose 114 mg/dL    Comment: (NOTE) Performed At: [facility] [place], [place] [id number] Lindon Romp MD VA:[id number]   Troponin I (q 6hr x 3)     Status: None   Collection Time: 09/09/16  9:47 AM  Result Value Ref Range   Troponin I <0.03 <0.03 ng/mL  HIV antibody     Status: None   Collection Time: 09/09/16  9:47 AM  Result Value Ref Range   HIV Screen 4th Generation wRfx Non Reactive Non Reactive    Comment: (NOTE) Performed At: [facility] [place], [place] [id number] Lindon Romp MD VJ:[id number]   Magnesium     Status: None   Collection Time: 09/09/16  9:47 AM  Result Value Ref Range   Magnesium 1.9 1.7 - 2.4 mg/dL  Glucose, capillary     Status: Abnormal   Collection Time: 09/09/16 12:34 PM  Result Value Ref Range   Glucose-Capillary 142 (H) 65 - 99 mg/dL   Comment 1 Notify RN    Comment 2 Document in Chart   Troponin I (q 6hr x 3)     Status: None   Collection Time: 09/09/16  2:46 PM  Result Value Ref Range   Troponin I <0.03 <0.03 ng/mL  Glucose, capillary     Status: Abnormal   Collection Time: 09/09/16  4:31 PM  Result Value Ref Range   Glucose-Capillary 132 (H) 65 - 99 mg/dL  Glucose, capillary     Status: Abnormal   Collection Time: 09/09/16  8:02 PM  Result Value Ref Range   Glucose-Capillary 119 (H) 65 - 99 mg/dL    Comment 1 Notify RN    Comment 2 Document in Chart   Troponin I (q 6hr x 3)     Status: None   Collection Time: 09/09/16  8:58 PM  Result Value Ref Range   Troponin I <0.03 <0.03 ng/mL  Glucose, capillary     Status: Abnormal   Collection Time: 09/10/16 12:15 AM  Result Value Ref Range   Glucose-Capillary 136 (H) 65 - 99 mg/dL  Glucose, capillary     Status: Abnormal   Collection Time: 09/10/16  4:16 AM  Result Value Ref Range   Glucose-Capillary 147 (H) 65 - 99 mg/dL  Basic metabolic panel     Status: Abnormal   Collection Time: 09/10/16  6:10 AM  Result Value Ref Range   Sodium 140 135 - 145 mmol/L   Potassium 3.7 3.5 - 5.1 mmol/L   Chloride 112 (H) 101 - 111 mmol/L   CO2 21 (L) 22 - 32 mmol/L   Glucose, Bld 157 (H) 65 - 99 mg/dL   BUN 25 (H) 6 - 20 mg/dL   Creatinine, Ser 0.56 0.44 - 1.00 mg/dL   Calcium 8.5 (L) 8.9 - 10.3 mg/dL   GFR calc non Af Amer >60 >60 mL/min   GFR calc Af Amer >60 >60 mL/min    Comment: (NOTE) The eGFR has been calculated using the CKD EPI equation. This calculation has not been validated in all clinical situations. eGFR's persistently <60 mL/min signify possible Chronic Kidney Disease.    Anion gap  7 5 - 15  Magnesium     Status: None   Collection Time: 09/10/16  6:10 AM  Result Value Ref Range   Magnesium 2.2 1.7 - 2.4 mg/dL  Glucose, capillary     Status: Abnormal   Collection Time: 09/10/16  8:02 AM  Result Value Ref Range   Glucose-Capillary 166 (H) 65 - 99 mg/dL   Comment 1 Notify RN    Comment 2 Document in Chart   Glucose, capillary     Status: Abnormal   Collection Time: 09/10/16 11:26 AM  Result Value Ref Range   Glucose-Capillary 153 (H) 65 - 99 mg/dL   Comment 1 Notify RN    Comment 2 Document in Chart   Glucose, capillary     Status: Abnormal   Collection Time: 09/10/16  6:12 PM  Result Value Ref Range   Glucose-Capillary 129 (H) 65 - 99 mg/dL  Glucose, capillary     Status: Abnormal   Collection Time: 09/10/16  9:06 PM    Result Value Ref Range   Glucose-Capillary 126 (H) 65 - 99 mg/dL   Comment 1 Notify RN    Comment 2 Document in Chart   Basic metabolic panel     Status: Abnormal   Collection Time: 09/11/16  4:22 AM  Result Value Ref Range   Sodium 138 135 - 145 mmol/L   Potassium 3.9 3.5 - 5.1 mmol/L   Chloride 107 101 - 111 mmol/L   CO2 20 (L) 22 - 32 mmol/L   Glucose, Bld 151 (H) 65 - 99 mg/dL   BUN 25 (H) 6 - 20 mg/dL   Creatinine, Ser 0.54 0.44 - 1.00 mg/dL   Calcium 8.5 (L) 8.9 - 10.3 mg/dL   GFR calc non Af Amer >60 >60 mL/min   GFR calc Af Amer >60 >60 mL/min    Comment: (NOTE) The eGFR has been calculated using the CKD EPI equation. This calculation has not been validated in all clinical situations. eGFR's persistently <60 mL/min signify possible Chronic Kidney Disease.    Anion gap 11 5 - 15  Glucose, capillary     Status: Abnormal   Collection Time: 09/11/16  7:24 AM  Result Value Ref Range   Glucose-Capillary 166 (H) 65 - 99 mg/dL   Comment 1 Notify RN    Comment 2 Document in Chart     Studies/Results:  FINDINGS: MRI CERVICAL SPINE FINDINGS  Alignment: Normal  Vertebrae: No fracture, evidence of discitis, or bone lesion.  Cord: Normal signal and volume.  Posterior Fossa, vertebral arteries, paraspinal tissues: Negative  Disc levels:  No significant degenerative change.  No impingement.  MRI THORACIC SPINE FINDINGS  Alignment:  Mildly exaggerated thoracic kyphosis.  Vertebrae: No fracture, evidence of discitis, or bone lesion.  Cord:  Normal signal and morphology.  Paraspinal and other soft tissues: Negative.  Disc levels:  No impingement or significant degenerative change.  MRI LUMBAR SPINE FINDINGS  Segmentation: 5 lumbar type vertebral bodies based on the lowest visible ribs.  Alignment:  Grade 1 anterolisthesis at L5-S1, chronic  Vertebrae:  No fracture, evidence of discitis, or bone lesion.  Conus medullaris: Extends to the  T12-L1 level and appears normal.  Paraspinal and other soft tissues: Negative  Disc levels:  T12- L1: Unremarkable.  L1-L2: Unremarkable.  L2-L3: Mild disc desiccation without narrowing or herniation.  L3-L4: Early facet spurring.  No impingement  L4-L5: Mild disc desiccation with tiny posterior annular fissure. Minor facet spurring on the right. No impingement  L5-S1:Chronic pars defects bilaterally with grade 1 anterolisthesis and focal disc narrowing. The foramina are narrowed but there is no L5 compression.  IMPRESSION: 1. Normal appearance of the cervical and thoracic cord. No evidence of demyelination. 2. 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 No compressive stenosis.    Zai Chmiel A. Merlene Laughter, M.D.  Diplomate, Tax adviser of Psychiatry and Neurology ( Neurology). 09/11/2016, 8:46 AM

## 2016-09-26 ENCOUNTER — Ambulatory Visit (HOSPITAL_COMMUNITY): Payer: BLUE CROSS/BLUE SHIELD | Attending: Family Medicine

## 2016-09-26 DIAGNOSIS — R2689 Other abnormalities of gait and mobility: Secondary | ICD-10-CM | POA: Insufficient documentation

## 2016-09-26 DIAGNOSIS — R2681 Unsteadiness on feet: Secondary | ICD-10-CM | POA: Diagnosis present

## 2016-09-26 DIAGNOSIS — R262 Difficulty in walking, not elsewhere classified: Secondary | ICD-10-CM | POA: Insufficient documentation

## 2016-09-26 DIAGNOSIS — I69854 Hemiplegia and hemiparesis following other cerebrovascular disease affecting left non-dominant side: Secondary | ICD-10-CM | POA: Insufficient documentation

## 2016-09-26 DIAGNOSIS — M6281 Muscle weakness (generalized): Secondary | ICD-10-CM | POA: Insufficient documentation

## 2016-09-26 DIAGNOSIS — G8929 Other chronic pain: Secondary | ICD-10-CM | POA: Insufficient documentation

## 2016-09-26 DIAGNOSIS — M545 Low back pain: Secondary | ICD-10-CM | POA: Diagnosis present

## 2016-09-26 NOTE — Therapy (Signed)
[facility] [address] [place], [place], [address] Phone: [phone]   Fax:  [phone]  Physical Therapy Evaluation  Patient Details  Name: [name] MRN: [id number] Date of Birth: 07/18/1971 Referring Provider: Adalberto Ill   Encounter Date: 09/26/2016      PT End of Session - 09/26/16 1517    Visit Number 1   Number of Visits 12   Date for PT Re-Evaluation 10/17/16   Authorization Type BCBS   Authorization Time Period 09/26/16-11/07/16   PT Start Time 1348   PT Stop Time 1433   PT Time Calculation (min) 45 min   Equipment Utilized During Treatment Gait belt   Activity Tolerance Patient limited by fatigue;No increased pain;Patient tolerated treatment well   Behavior During Therapy [facility] for tasks assessed/performed      Past Medical History:  Diagnosis Date  . Headache   . Multiple sclerosis ([facility]) 06/19/2011  . Multiple sclerosis ([facility]) 06/19/2011    No past surgical history on file.  There were no vitals filed for this visit.       Subjective Assessment - 09/26/16 1353    Subjective Pt sustained sudden onset of hemiplegic weakness about 1 month ago, mostly in the face/head/tongue, admitted for MS exacerabtion. She has been slowly improving strength at home since DC, inittially in [facility] at home, adn then gradualyl progressing to short hosuehold distances.    Patient is accompained by: Interpreter  Driscilla Moats    Pertinent History MS since 2009   How long can you sit comfortably? No tlimited at this time.    How long can you stand comfortably? estimated 10-15 minutes;    How long can you walk comfortably? since DC from acute care, only room to room.    Patient Stated Goals improving ability to walk better.    Currently in Pain? No/denies            [facility] PT Assessment - 09/26/16 0001      Assessment   Medical Diagnosis s/p MS exacerbation    Referring Provider Adalberto Ill    Next MD  Visit None scheduled    Prior Therapy At this facility for back pain     Precautions   Precautions None     Restrictions   Weight Bearing Restrictions No     Balance Screen   Has the patient fallen in the past 6 months No   Has the patient had a decrease in activity level because of a fear of falling?  No   Is the patient reluctant to leave their home because of a fear of falling?  No     Home [employer]ist information centre manager residence     Prior Function   Level of Independence Independent   Vocation Unemployed   Leisure taking care of 3 grandchildren       Cognition   Overall Cognitive Status Within Functional Limits for tasks assessed     Sensation   Light Touch Impaired Detail   Additional Comments decreased LT sensation on LLE L3-S1  sensation loss not purely dermatonal      Strength   Right Hip Flexion 5/5   Right Hip External Rotation  5/5   Right Hip Internal Rotation 5/5   Right Hip ABduction --  horizontal ABD: 5/5   Left Hip Flexion 4+/5   Left Hip External Rotation 4-/5   Left Hip Internal Rotation 4-/5   Left Hip ABduction --  horizontal ABD: 3+/5   Right Knee Flexion 4-/5   Right Knee Extension 5/5   Left Knee Flexion 3+/5   Left Knee Extension 5/5   Right Ankle Dorsiflexion 5/5   Left Ankle Dorsiflexion 5/5     Transfers   Five time sit to stand comments  27.48s  arms across chest     Ambulation/Gait   Ambulation Distance (Feet) 33 Feet  ( )    Assistive device Straight cane   Gait velocity 0.44m/s   Gait Comments decreased LLE weight bearing and stance time, moderate LLE abducted gait.       Balance   Balance Assessed Yes     Static Standing Balance   Static Standing Balance -  Activities  Tandam Stance - Right Leg;Tandam Stance - Left Leg;Romberg - Eyes Closed;Sharpened Romberg - Eyes Open  30+s Sharpened Rhom eyes open; 5sec Rhom eyes closed;   Static Standing - Comment/# of Minutes --  Semitandem stance >15sec bilat;  poor posture during Left sta     Standardized Balance Assessment   Standardized Balance Assessment 10 meter walk test                   [facility] Adult PT Treatment/Exercise - 09/26/16 0001      Lumbar Exercises: Seated   Long Arc Quad on Chair Left;1 set;10 reps;AROM  (added to HEP)     Lumbar Exercises: Supine   Bridge 10 reps  (HEP addition)      Lumbar Exercises: Prone   Other Prone Lumbar Exercises Prone knee flexion: Left 1x10 9HEP addition)              Balance Exercises - 09/26/16 1507      Balance Exercises: Standing   Standing Eyes Opened Narrow base of support (BOS);Solid surface;1 rep;30 secs;Time  (HEP addition)            PT Education - 09/26/16 1437    Education provided Yes   Education Details importance of safety during practice of balance.    Person(s) Educated Patient   Methods Explanation;Demonstration   Comprehension Verbalized understanding;Returned demonstration          PT Short Term Goals - 09/26/16 1536      PT SHORT TERM GOAL #1   Title After 3 weeks pt will demonstrate consistency and independence with her HEP to improve functional strength and mobility.    Status New     PT SHORT TERM GOAL #2   Title After 3 weeks patient will improve functional AMB AEB in less than 12 seconds.    Status New     PT SHORT TERM GOAL #3   Title After 3 weeks patient will demonstrate improved funcitonal strength and baalnce AEB 5xSTS < 20sec hands free.    Status New           PT Long Term Goals - 09/26/16 1538      PT LONG TERM GOAL #1   Title After 6 weeks patient will demonstrate improved funcitonal strength and baalnce AEB 5xSTS < 16sec hands free without LOB.    Status New     PT LONG TERM GOAL #2   Title After 6 weeks patient will improve functional AMB AEB in less than 9 seconds with SPC and less than 25% asymmetry AEB clinical judgment of therapist.    Status New     PT LONG TERM GOAL #3   Title After 6  weeks pt will demonstrate Improved balance  AEB compeltion of 2 of 3 of the following measures: bilat SLS>10sec, Rhomberg Eyes Closed >30esc, or BBT: >45/56.    Status New               Plan - 09/26/16 1522    Clinical Impression Statement Pt presents several weeks after MS exacerbation and hospitalization to R/O other postential causes for acut eonset hemiplegia. Pt is familiar to this PT from acute care evalaution. Pt demonstrating mild decreased ilight touch sensation in the LLE mostly below the knee. Strength assessment reveals weakmness in LLE in most muscle groups able to provide a little resistance, but significantly decreased since last reassessment visit . Additional impairment described in detail in this note includes decreased gait tolerance, quality, and speed, and decreased balance. Pt demonstrating vast improvement in gait since inpatient PT evaluation, then AMB <23feet and today able to tolerate up to 59ft at a time with standing rest breaks bewteen efforts. Pt will benefit from skilled PT intervention to address documents decitis, to improve impairment, and return to PLOF in ADL, IADL, and leisure activity. .     Rehab Potential Good   PT Frequency 2x / week   PT Duration 6 weeks   PT Treatment/Interventions ADLs/Self Care Home Management;Moist Heat;Therapeutic exercise;Therapeutic activities;Functional mobility training;Stair training;Gait training;Balance training;Neuromuscular re-education;Patient/family education;Manual techniques;Passive range of motion;Aquatic Therapy;Energy conservation   PT Next Visit Plan Review HEP and treatment goals; continue with generalized functional strengthing for gait and transfers, followed by isolated strengthening in LLE (low load/high repitition) of areas of focal deficit. hold high volume balance training un until strength is somewhat improved.    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 Difficulty in walking, not elsewhere classified   . Hemiparesis, left (HCC)   . Atypical chest pain 09/09/2016  . Hyperglycemia 09/09/2016  . Left-sided weakness   . Frequent headaches 09/08/2016  . Multiple sclerosis exacerbation ([facility]) 09/08/2016  . Relapsing remitting multiple sclerosis ([facility]) 10/19/2015  . Nexplanon insertion 05/15/2015  . Multiple sclerosis ([facility]CC) 06/19/2011   3:46 PM, 09/26/16 Rosamaria Lints, PT, DPT Physical Therapist at [facility] [phone] (office)     [facility] [address] [place], [place], [address] Phone: [phone]   Fax:  [phone]  Name: [name] MRN: [id number] Date of Birth: Mar 04, 1971

## 2016-09-27 ENCOUNTER — Ambulatory Visit (HOSPITAL_COMMUNITY): Payer: BLUE CROSS/BLUE SHIELD

## 2016-09-27 DIAGNOSIS — R2689 Other abnormalities of gait and mobility: Secondary | ICD-10-CM

## 2016-09-27 DIAGNOSIS — M545 Low back pain: Secondary | ICD-10-CM

## 2016-09-27 DIAGNOSIS — M6281 Muscle weakness (generalized): Secondary | ICD-10-CM

## 2016-09-27 DIAGNOSIS — R262 Difficulty in walking, not elsewhere classified: Secondary | ICD-10-CM | POA: Diagnosis not present

## 2016-09-27 DIAGNOSIS — R2681 Unsteadiness on feet: Secondary | ICD-10-CM

## 2016-09-27 DIAGNOSIS — G8929 Other chronic pain: Secondary | ICD-10-CM

## 2016-09-27 DIAGNOSIS — I69854 Hemiplegia and hemiparesis following other cerebrovascular disease affecting left non-dominant side: Secondary | ICD-10-CM

## 2016-09-27 NOTE — Therapy (Signed)
[facility] [address] [place], [place], [address] Phone: [phone]   Fax:  [phone]  Physical Therapy Treatment  Patient Details  Name: [name] MRN: [id number] Date of Birth: 1970/10/23 Referring Provider: Adalberto Ill   Encounter Date: 09/27/2016      PT End of Session - 09/27/16 1126    Visit Number 2   Number of Visits 12   Date for PT Re-Evaluation 10/17/16   Authorization Type BCBS   Authorization Time Period 09/26/16-11/07/16   PT Start Time 1122   PT Stop Time 1200   PT Time Calculation (min) 38 min   Equipment Utilized During Treatment Gait belt   Activity Tolerance Patient limited by fatigue;No increased pain;Patient tolerated treatment well  Fatigue level at 50% through session   Behavior During Therapy [facility] for tasks assessed/performed      Past Medical History:  Diagnosis Date  . Headache   . Multiple sclerosis (HCC) 06/19/2011  . Multiple sclerosis (HCC) 06/19/2011    No past surgical history on file.  There were no vitals filed for this visit.      Subjective Assessment - 09/27/16 1125    Subjective Pt stated she is feeling better today, no reports of pain.    Feels she is walking better today.     Pertinent History MS since 2009   Patient Stated Goals improving ability to walk better.    Currently in Pain? No/denies                         [facility] Adult PT Treatment/Exercise - 09/27/16 0001      Lumbar Exercises: Seated   Long Arc Quad on Chair Both;2 sets;10 reps   LAQ on Chair Limitations knee flexion with RTB 2x10   Sit to Stand 5 reps   Sit to Stand Limitations no UE's; 5x 2 sets     Lumbar Exercises: Supine   Bridge 10 reps  2 sets; unable to complete full range due to c/o back pain             Balance Exercises - 09/27/16 1211      Balance Exercises: Standing   Standing Eyes Opened Narrow base of support (BOS);Solid surface;1 rep;30  secs;Time   Tandem Stance Eyes open;2 reps;30 secs           PT Education - 09/26/16 1437    Education provided Yes   Education Details importance of safety during practice of balance.    Person(s) Educated Patient   Methods Explanation;Demonstration   Comprehension Verbalized understanding;Returned demonstration          PT Short Term Goals - 09/26/16 1536      PT SHORT TERM GOAL #1   Title After 3 weeks pt will demonstrate consistency and independence with her HEP to improve functional strength and mobility.    Status New     PT SHORT TERM GOAL #2   Title After 3 weeks patient will improve functional AMB AEB in less than 12 seconds.    Status New     PT SHORT TERM GOAL #3   Title After 3 weeks patient will demonstrate improved funcitonal strength and baalnce AEB 5xSTS < 20sec hands free.    Status New           PT Long Term Goals - 09/26/16 1538      PT LONG TERM GOAL #1   Title After  6 weeks patient will demonstrate improved funcitonal strength and baalnce AEB 5xSTS < 16sec hands free without LOB.    Status New     PT LONG TERM GOAL #2   Title After 6 weeks patient will improve functional AMB AEB in less than 9 seconds with SPC and less than 25% asymmetry AEB clinical judgment of therapist.    Status New     PT LONG TERM GOAL #3   Title After 6 weeks pt will demonstrate Improved balance AEB compeltion of 2 of 3 of the following measures: bilat SLS>10sec, Rhomberg Eyes Closed >30esc, or BBT: >45/56.    Status New               Plan - 09/27/16 1203    Clinical Impression Statement Bianca interpreter present through session.  Began session reviewing goals, educated pt on importance of compliance with HEP, copy of eval given to pt. with family member present who stated he could tranfer for her.  Reviewed form and technique wiht HEP with bridges modified form to address back pain during exercise.  Educated pt importance of compliance iwht HEP  and encouraged to increase frequency.  Pt able to complete all therex with minimal difficutly following initial instructions.  Assessed fatigue level through session with rest breaks given as appropriate with majority of session at 50% fatigue level.  EoS no reports of increased pain.     Rehab Potential Good   PT Frequency 2x / week   PT Duration 6 weeks   PT Treatment/Interventions ADLs/Self Care Home Management;Moist Heat;Therapeutic exercise;Therapeutic activities;Functional mobility training;Stair training;Gait training;Balance training;Neuromuscular re-education;Patient/family education;Manual techniques;Passive range of motion;Aquatic Therapy;Energy conservation   PT Next Visit Plan review compliance wiht HEP; continue with generalized functional strengthing for gait and transfers, followed by isolated strengthening in LLE (low load/high repitition) of areas of focal deficit. hold high volume balance training un until strength is somewhat improved.    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 Difficulty in walking, not elsewhere classified   . Hemiparesis, left (HCC)   . Atypical chest pain 09/09/2016   . Hyperglycemia 09/09/2016  . Left-sided weakness   . Frequent headaches 09/08/2016  . Multiple sclerosis exacerbation (HCC) 09/08/2016  . Relapsing remitting multiple sclerosis (HCC) 10/19/2015  . Nexplanon insertion 05/15/2015  . Multiple sclerosis (HCC) 06/19/2011    Juel Burrow 09/27/2016, 12:11 PM  [place] [facility] [address] [place], [place], [address] Phone: [phone]   Fax:  [phone]  Name: [name] MRN: [id number] Date of Birth: [birth year]

## 2016-10-02 ENCOUNTER — Ambulatory Visit (HOSPITAL_COMMUNITY): Payer: BLUE CROSS/BLUE SHIELD

## 2016-10-02 DIAGNOSIS — R2681 Unsteadiness on feet: Secondary | ICD-10-CM

## 2016-10-02 DIAGNOSIS — R262 Difficulty in walking, not elsewhere classified: Secondary | ICD-10-CM | POA: Diagnosis not present

## 2016-10-02 DIAGNOSIS — I69854 Hemiplegia and hemiparesis following other cerebrovascular disease affecting left non-dominant side: Secondary | ICD-10-CM

## 2016-10-02 NOTE — Therapy (Signed)
[facility] [address] [place], [place], [address] Phone: [phone]   Fax:  [phone]  Physical Therapy Treatment  Patient Details  Name: [name] MRN: [id number] Date of Birth: [birth year] Referring Provider: Adalberto Ill   Encounter Date: 10/02/2016      PT End of Session - 10/02/16 1658    Visit Number 3   Number of Visits 12   Date for PT Re-Evaluation 10/17/16   Authorization Type BCBS   Authorization Time Period 09/26/16-11/07/16   PT Start Time 1648   PT Stop Time 1732   PT Time Calculation (min) 44 min   Equipment Utilized During Treatment Gait belt   Activity Tolerance Patient limited by fatigue;No increased pain;Patient tolerated treatment well   Behavior During Therapy [facility] for tasks assessed/performed      Past Medical History:  Diagnosis Date  . Headache   . Multiple sclerosis (HCC) 06/19/2011  . Multiple sclerosis (HCC) 06/19/2011    No past surgical history on file.  There were no vitals filed for this visit.      Subjective Assessment - 10/02/16 1654    Subjective Pt stated she is feeling good today, no reports of pain today.  Reports compliance with HEP.  Did c/o Lt LE radicular symptoms yesterday down to knee.   Pertinent History MS since 2009   Patient Stated Goals improving ability to walk better.    Currently in Pain? No/denies                         [facility] Adult PT Treatment/Exercise - 10/02/16 0001      Lumbar Exercises: Seated   Sit to Stand 10 reps   Sit to Stand Limitations no HHA     Lumbar Exercises: Supine   Bridge 10 reps   Bridge Limitations 3 sets   Straight Leg Raise 10 reps   Straight Leg Raises Limitations 2 sets     Lumbar Exercises: Sidelying   Clam 10 reps   Clam Limitations red TB 2 sets   Hip Abduction 10 reps   Hip Abduction Weights (lbs) 2 sets     Lumbar Exercises: Prone   Other Prone Lumbar Exercises hamstring curls RTB 15x              Balance Exercises - 10/02/16 1749      Balance Exercises: Standing   Tandem Stance Eyes open;Foam/compliant surface;3 reps;30 secs  Last set wtih rotation with ball in tandem stance2x 10              PT Short Term Goals - 09/26/16 1536      PT SHORT TERM GOAL #1   Title After 3 weeks pt will demonstrate consistency and independence with her HEP to improve functional strength and mobility.    Status New     PT SHORT TERM GOAL #2   Title After 3 weeks patient will improve functional AMB AEB in less than 12 seconds.    Status New     PT SHORT TERM GOAL #3   Title After 3 weeks patient will demonstrate improved funcitonal strength and baalnce AEB 5xSTS < 20sec hands free.    Status New           PT Long Term Goals - 09/26/16 1538      PT LONG TERM GOAL #1   Title After 6 weeks patient will demonstrate improved funcitonal strength and baalnce AEB  5xSTS < 16sec hands free without LOB.    Status New     PT LONG TERM GOAL #2   Title After 6 weeks patient will improve functional AMB AEB in less than 9 seconds with SPC and less than 25% asymmetry AEB clinical judgment of therapist.    Status New     PT LONG TERM GOAL #3   Title After 6 weeks pt will demonstrate Improved balance AEB compeltion of 2 of 3 of the following measures: bilat SLS>10sec, Rhomberg Eyes Closed >30esc, or BBT: >45/56.    Status New               Plan - 10/02/16 1744    Clinical Impression Statement Interpreter present through session.  Reviewed compliance iwth HEP with reports of completing daily.  Continued session focus on proximal strengthening and static balance training.  Able to increased sets with bridges and began glut med strengthening exercises.  Pt able to complete all exercises with minimal cueing for form.  Assess fatigue level through session with average level between 4-6/10.  No reports of pain through session.  DId add dynamic surface with core  strenghtening therex with min A requried for LOB episodes on foam.     Rehab Potential Good   PT Frequency 2x / week   PT Duration 6 weeks   PT Treatment/Interventions ADLs/Self Care Home Management;Moist Heat;Therapeutic exercise;Therapeutic activities;Functional mobility training;Stair training;Gait training;Balance training;Neuromuscular re-education;Patient/family education;Manual techniques;Passive range of motion;Aquatic Therapy;Energy conservation   PT Next Visit Plan continue with generalized functional strengthing for gait and transfers, followed by isolated strengthening in LLE (low load/high repitition) of areas of focal deficit. hold high volume balance training un until strength is somewhat improved.    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 Difficulty in walking, not elsewhere classified   . Hemiparesis, left (HCC)   . Atypical chest pain 09/09/2016  . Hyperglycemia 09/09/2016  . Left-sided weakness   . Frequent headaches 09/08/2016  . Multiple sclerosis exacerbation (HCC) 09/08/2016  . Relapsing remitting multiple sclerosis (HCC) 10/19/2015  . Nexplanon insertion 05/15/2015  . Multiple sclerosis (HCC) 06/19/2011   [name], LPTA; CBIS [phone]  Juel Burrow 10/02/2016, 5:50 PM  [place] [address] [place], [place], [address] Phone: [phone]   Fax:  [phone]  Name: [name] MRN: [id number] Date of Birth: Feb 18, 1971

## 2016-10-04 ENCOUNTER — Telehealth (HOSPITAL_COMMUNITY): Payer: Self-pay

## 2016-10-04 ENCOUNTER — Ambulatory Visit (HOSPITAL_COMMUNITY): Payer: BLUE CROSS/BLUE SHIELD | Attending: Family Medicine

## 2016-10-04 DIAGNOSIS — R2681 Unsteadiness on feet: Secondary | ICD-10-CM | POA: Diagnosis present

## 2016-10-04 DIAGNOSIS — M6281 Muscle weakness (generalized): Secondary | ICD-10-CM

## 2016-10-04 DIAGNOSIS — G8929 Other chronic pain: Secondary | ICD-10-CM

## 2016-10-04 DIAGNOSIS — M545 Low back pain: Secondary | ICD-10-CM | POA: Insufficient documentation

## 2016-10-04 DIAGNOSIS — R2689 Other abnormalities of gait and mobility: Secondary | ICD-10-CM

## 2016-10-04 DIAGNOSIS — R262 Difficulty in walking, not elsewhere classified: Secondary | ICD-10-CM

## 2016-10-04 DIAGNOSIS — I69854 Hemiplegia and hemiparesis following other cerebrovascular disease affecting left non-dominant side: Secondary | ICD-10-CM | POA: Diagnosis present

## 2016-10-04 NOTE — Therapy (Signed)
[facility] [address] [place], [place], [address] Phone: [phone]   Fax:  [phone]  Physical Therapy Treatment  Patient Details  Name: [name] MRN: [id number] Date of Birth: July 07, 1971 Referring Provider: Adalberto Ill   Encounter Date: 10/04/2016      PT End of Session - 10/04/16 1749    Visit Number 4   Number of Visits 125   Date for PT Re-Evaluation 10/17/16   Authorization Type BCBS   Authorization Time Period 09/26/16-11/07/16   PT Start Time 1742  pt late for apt   PT Stop Time 1819   PT Time Calculation (min) 37 min   Equipment Utilized During Treatment Gait belt   Activity Tolerance Patient limited by fatigue;Patient tolerated treatment well   Behavior During Therapy [facility] for tasks assessed/performed      Past Medical History:  Diagnosis Date  . Headache   . Multiple sclerosis (HCC) 06/19/2011  . Multiple sclerosis (HCC) 06/19/2011    No past surgical history on file.  There were no vitals filed for this visit.      Subjective Assessment - 10/04/16 1740    Subjective Pt late for apt today.  Reports she is feelling good today, no reoprts of pain.  Compliant with HEP   Patient is accompained by: Interpreter;Family member  Sent female interpreter, family interpreted today   Pertinent History MS since 2009   Patient Stated Goals improving ability to walk better.    Currently in Pain? No/denies                         [facility] Adult PT Treatment/Exercise - 10/04/16 0001      Lumbar Exercises: Aerobic   Stationary Bike Nustep x 3 min at EOS (No charge)     Lumbar Exercises: Standing   Heel Raises 15 reps   Heel Raises Limitations heel and toe raises   Other Standing Lumbar Exercises tandem stance on foam 2x 30"   Other Standing Lumbar Exercises Marching alternating 10x each; SLS Lt 9", Rt 18" (hold SLS activities until strength improves increased increased pain)     Lumbar Exercises: Supine   Bridge 15 reps   Bridge Limitations 2 sets     Lumbar Exercises: Sidelying   Hip Abduction 15 reps   Hip Abduction Weights (lbs) 2 sets     Lumbar Exercises: Prone   Straight Leg Raise 10 reps   Other Prone Lumbar Exercises hamstring curls RTB 15x                  PT Short Term Goals - 09/26/16 1536      PT SHORT TERM GOAL #1   Title After 3 weeks pt will demonstrate consistency and independence with her HEP to improve functional strength and mobility.    Status New     PT SHORT TERM GOAL #2   Title After 3 weeks patient will improve functional AMB AEB in less than 12 seconds.    Status New     PT SHORT TERM GOAL #3   Title After 3 weeks patient will demonstrate improved funcitonal strength and baalnce AEB 5xSTS < 20sec hands free.    Status New           PT Long Term Goals - 09/26/16 1538      PT LONG TERM GOAL #1   Title After 6 weeks patient will demonstrate improved funcitonal strength and  baalnce AEB 5xSTS < 16sec hands free without LOB.    Status New     PT LONG TERM GOAL #2   Title After 6 weeks patient will improve functional AMB AEB in less than 9 seconds with SPC and less than 25% asymmetry AEB clinical judgment of therapist.    Status New     PT LONG TERM GOAL #3   Title After 6 weeks pt will demonstrate Improved balance AEB compeltion of 2 of 3 of the following measures: bilat SLS>10sec, Rhomberg Eyes Closed >30esc, or BBT: >45/56.    Status New               Plan - 10/04/16 1750    Clinical Impression Statement Female interpreter arrived from agency, pt uncomfortable with female interpreter so family interpreted through session today.  Husband stated family members will be with pt. each session and no need for interpreters.  Continues session focus on improving LE strengthening.  Progressed to closed chain strengthening exercises.  Added static balance activites on dynamic surface.  Pt reports increased  Lt thigh pain with SLS activities with noted increased antalgic gait mechanics following reoprts.  Rest breaks given upon request and fatigue level was assessed through session with average range from 5-6/10.  EOS with nustep for activity tolerance.  Pt and husband educated on techniques to assist with thigh pain (application with ice).   Rehab Potential Good   PT Frequency 2x / week   PT Duration 6 weeks   PT Treatment/Interventions ADLs/Self Care Home Management;Moist Heat;Therapeutic exercise;Therapeutic activities;Functional mobility training;Stair training;Gait training;Balance training;Neuromuscular re-education;Patient/family education;Manual techniques;Passive range of motion;Aquatic Therapy;Energy conservation   PT Next Visit Plan continue with generalized functional strengthing for gait and transfers, followed by isolated strengthening in LLE (low load/high repitition) of areas of focal deficit. hold high volume balance training un until strength is somewhat improved.    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 Difficulty in walking, not elsewhere classified   . Hemiparesis, left (HCC)   . Atypical  chest pain 09/09/2016  . Hyperglycemia 09/09/2016  . Left-sided weakness   . Frequent headaches 09/08/2016  . Multiple sclerosis exacerbation (HCC) 09/08/2016  . Relapsing remitting multiple sclerosis (HCC) 10/19/2015  . Nexplanon insertion 05/15/2015  . Multiple sclerosis (HCC) 06/19/2011   [name], LPTA; CBIS [phone]  Juel Burrow 10/04/2016, 6:25 PM  [facility] [address] [place], [place], [address] Phone: [phone]   Fax:  [phone]  Name: [name] MRN: [id number] Date of Birth: August 31, 1970

## 2016-10-04 NOTE — Telephone Encounter (Signed)
Female Interpreter called he states if family only wants a lady interpreter he respects thtat and he will not come today b/c he is an hour away if they will not accept his services. I called Olegario Messier Chiropodist) an requested a female interpreter for this patient if possible for today. Olegario Messier will call be back. NF 4:16pm 10/04/16

## 2016-10-09 ENCOUNTER — Ambulatory Visit (HOSPITAL_COMMUNITY): Payer: BLUE CROSS/BLUE SHIELD

## 2016-10-09 DIAGNOSIS — I69854 Hemiplegia and hemiparesis following other cerebrovascular disease affecting left non-dominant side: Secondary | ICD-10-CM

## 2016-10-09 DIAGNOSIS — R262 Difficulty in walking, not elsewhere classified: Secondary | ICD-10-CM

## 2016-10-09 DIAGNOSIS — G8929 Other chronic pain: Secondary | ICD-10-CM

## 2016-10-09 DIAGNOSIS — M545 Low back pain: Secondary | ICD-10-CM

## 2016-10-09 DIAGNOSIS — M6281 Muscle weakness (generalized): Secondary | ICD-10-CM

## 2016-10-09 DIAGNOSIS — R2681 Unsteadiness on feet: Secondary | ICD-10-CM

## 2016-10-09 NOTE — Therapy (Signed)
[facility] [address] [place], [place], [address] Phone: [phone]   Fax:  [phone]  Physical Therapy Treatment  Patient Details  Name: [name] MRN: [id number] Date of Birth: 1970-09-23 Referring Provider: Adalberto Ill   Encounter Date: 10/09/2016      PT End of Session - 10/09/16 0853    Visit Number 5   Number of Visits 15   Date for PT Re-Evaluation 10/17/16   Authorization Type BCBS   Authorization Time Period 09/26/16-11/07/16   PT Start Time 0816   PT Stop Time 0855   PT Time Calculation (min) 39 min   Equipment Utilized During Treatment Gait belt   Activity Tolerance Patient limited by fatigue;Patient tolerated treatment well  Fatigue level at 5-6/10 at EOS   Behavior During Therapy [facility] for tasks assessed/performed      Past Medical History:  Diagnosis Date  . Headache   . Multiple sclerosis (HCC) 06/19/2011  . Multiple sclerosis (HCC) 06/19/2011    No past surgical history on file.  There were no vitals filed for this visit.      Subjective Assessment - 10/09/16 0813    Subjective Pt stated she is feeling good today, no reports of pain today.  Pt stated she is having increased ease with functional tasks, improving slowly.   Pertinent History MS since 2009   Patient Stated Goals improving ability to walk better.    Currently in Pain? No/denies                         [facility] Adult PT Treatment/Exercise - 10/09/16 0001      Lumbar Exercises: Standing   Heel Raises 10 reps   Heel Raises Limitations heel and toe raises 2 sets   Functional Squats 10 reps   Functional Squats Limitations cueing for form/tech   Forward Lunge 10 reps   Forward Lunge Limitations 2 sets; onto 6" box wtihout UE assist   Other Standing Lumbar Exercises tandem stance on foam 2x 30"; tandem gait 1RT     Lumbar Exercises: Seated   Sit to Stand 10 reps   Sit to Stand Limitations no HHA      Lumbar Exercises: Supine   Bridge 15 reps     Lumbar Exercises: Sidelying   Hip Abduction 15 reps     Lumbar Exercises: Prone   Straight Leg Raise 10 reps   Straight Leg Raises Limitations 2 sets   Other Prone Lumbar Exercises hamstring curls 3# 15x                  PT Short Term Goals - 09/26/16 1536      PT SHORT TERM GOAL #1   Title After 3 weeks pt will demonstrate consistency and independence with her HEP to improve functional strength and mobility.    Status New     PT SHORT TERM GOAL #2   Title After 3 weeks patient will improve functional AMB AEB in less than 12 seconds.    Status New     PT SHORT TERM GOAL #3   Title After 3 weeks patient will demonstrate improved funcitonal strength and baalnce AEB 5xSTS < 20sec hands free.    Status New           PT Long Term Goals - 09/26/16 1538      PT LONG TERM GOAL #1   Title After 6 weeks patient will demonstrate  improved funcitonal strength and baalnce AEB 5xSTS < 16sec hands free without LOB.    Status New     PT LONG TERM GOAL #2   Title After 6 weeks patient will improve functional AMB AEB in less than 9 seconds with SPC and less than 25% asymmetry AEB clinical judgment of therapist.    Status New     PT LONG TERM GOAL #3   Title After 6 weeks pt will demonstrate Improved balance AEB compeltion of 2 of 3 of the following measures: bilat SLS>10sec, Rhomberg Eyes Closed >30esc, or BBT: >45/56.    Status New               Plan - 10/09/16 0858    Clinical Impression Statement [name] served at interpreter for PT session today.  Continued session focus on functional strengthening and activity tolerance.  Progressed closed chain strengthening exercises for functional strengthening with addition of forward lunges.  Pt able to complete all exercises with good form following initial demonstration and cueing for technique.  Rest breaks taken as needed.  EOS pt stated fatigue level at 5-7/10.    Rehab Potential Good   PT Frequency 2x / week   PT Duration 6 weeks   PT Treatment/Interventions ADLs/Self Care Home Management;Moist Heat;Therapeutic exercise;Therapeutic activities;Functional mobility training;Stair training;Gait training;Balance training;Neuromuscular re-education;Patient/family education;Manual techniques;Passive range of motion;Aquatic Therapy;Energy conservation   PT Next Visit Plan Begin Nustep next session for activitiy tolerance;  continue with generalized functional strengthing for gait and transfers, followed by isolated strengthening in LLE (low load/high repitition) of areas of focal deficit. hold high volume balance training un until strength is somewhat improved.    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 Difficulty in walking, not elsewhere classified   . Hemiparesis, left (HCC)   . Atypical chest pain 09/09/2016  . Hyperglycemia 09/09/2016  . Left-sided weakness   . Frequent headaches 09/08/2016  . Multiple sclerosis exacerbation (HCC) 09/08/2016  . Relapsing remitting multiple sclerosis (HCC) 10/19/2015  . Nexplanon insertion 05/15/2015  . Multiple sclerosis (HCC) 06/19/2011   [name], LPTA;  CBIS [phone]  Juel Burrow 10/09/2016, 6:50 PM  [facility] [address] [place], [place], [address] Phone: [phone]   Fax:  [phone]  Name: [name] MRN: [id number] Date of Birth: 06/20/1971

## 2016-10-11 ENCOUNTER — Ambulatory Visit (HOSPITAL_COMMUNITY): Payer: BLUE CROSS/BLUE SHIELD | Admitting: Physical Therapy

## 2016-10-11 DIAGNOSIS — R262 Difficulty in walking, not elsewhere classified: Secondary | ICD-10-CM | POA: Diagnosis not present

## 2016-10-11 DIAGNOSIS — I69854 Hemiplegia and hemiparesis following other cerebrovascular disease affecting left non-dominant side: Secondary | ICD-10-CM

## 2016-10-11 DIAGNOSIS — R2681 Unsteadiness on feet: Secondary | ICD-10-CM

## 2016-10-11 NOTE — Therapy (Signed)
[facility] [address] [place], [place], [address] Phone: [phone]   Fax:  [phone]  Physical Therapy Treatment  Patient Details  Name: [name] MRN: [id number] Date of Birth: [birth year] Referring Provider: Adalberto Ill   Encounter Date: 10/11/2016      PT End of Session - 10/11/16 1558    Visit Number 6   Number of Visits 15   Date for PT Re-Evaluation 10/17/16   Authorization Type BCBS   Authorization Time Period 09/26/16-11/07/16   PT Start Time 1520   PT Stop Time 1558   PT Time Calculation (min) 38 min   Equipment Utilized During Treatment Gait belt   Activity Tolerance Patient tolerated treatment well;No increased pain   Behavior During Therapy [facility] for tasks assessed/performed      Past Medical History:  Diagnosis Date  . Headache   . Multiple sclerosis (HCC) 06/19/2011  . Multiple sclerosis (HCC) 06/19/2011    No past surgical history on file.  There were no vitals filed for this visit.      Subjective Assessment - 10/11/16 1522    Subjective Pt states she is doing really good. She has been using her SPC on and off at home. She reports adherence to her HEP.    Pertinent History MS since 2009   Patient Stated Goals improving ability to walk better.    Currently in Pain? No/denies                         [facility] Adult PT Treatment/Exercise - 10/11/16 0001      Lumbar Exercises: Standing   Heel Raises 15 reps   Heel Raises Limitations single leg    Other Standing Lumbar Exercises heel raises on incline x20 reps    Other Standing Lumbar Exercises Hip hike from 2" box, x15 reps each      Lumbar Exercises: Seated   Sit to Stand 15 reps   Sit to Stand Limitations x2 sets, no UE support              Balance Exercises - 10/11/16 1542      Balance Exercises: Standing   Standing Eyes Opened Narrow base of support (BOS);Foam/compliant surface;2 reps;30 secs;Other  (comment)  x10 reps rotation Lt/Rt on foam    SLS 2 reps;Eyes open;30 secs   Tandem Gait 2 reps;Forward  CGA   Sidestepping 2 reps;Other (comment)  CGA           PT Education - 10/11/16 1558    Education provided Yes   Education Details upcoming reassessment to determine need for more/less visits based on progress   Person(s) Educated Patient   Methods Explanation   Comprehension Verbalized understanding          PT Short Term Goals - 09/26/16 1536      PT SHORT TERM GOAL #1   Title After 3 weeks pt will demonstrate consistency and independence with her HEP to improve functional strength and mobility.    Status New     PT SHORT TERM GOAL #2   Title After 3 weeks patient will improve functional AMB AEB in less than 12 seconds.    Status New     PT SHORT TERM GOAL #3   Title After 3 weeks patient will demonstrate improved funcitonal strength and baalnce AEB 5xSTS < 20sec hands free.    Status New  PT Long Term Goals - 09/26/16 1538      PT LONG TERM GOAL #1   Title After 6 weeks patient will demonstrate improved funcitonal strength and baalnce AEB 5xSTS < 16sec hands free without LOB.    Status New     PT LONG TERM GOAL #2   Title After 6 weeks patient will improve functional AMB AEB in less than 9 seconds with SPC and less than 25% asymmetry AEB clinical judgment of therapist.    Status New     PT LONG TERM GOAL #3   Title After 6 weeks pt will demonstrate Improved balance AEB compeltion of 2 of 3 of the following measures: bilat SLS>10sec, Rhomberg Eyes Closed >30esc, or BBT: >45/56.    Status New               Plan - 10/11/16 1559    Clinical Impression Statement Pt continues to make progress towards all goals with improving balance and functional strength. Increased reps and level of difficulty with all exercises and balance activities with pt only requiring 2 rest breaks during her session. She feels she is getting close to  her baseline so we discussed upcoming reassessment to determine possible change in POC. She verbalized understanding.   Rehab Potential Good   PT Frequency 2x / week   PT Duration 6 weeks   PT Treatment/Interventions ADLs/Self Care Home Management;Moist Heat;Therapeutic exercise;Therapeutic activities;Functional mobility training;Stair training;Gait training;Balance training;Neuromuscular re-education;Patient/family education;Manual techniques;Passive range of motion;Aquatic Therapy;Energy conservation   PT Next Visit Plan reassessment   PT Home Exercise Plan At evaluation: bridge, seated LAQ, prone knee flexion, narrow stance balance    Consulted and Agree with Plan of Care Patient;Other (Comment)  via Spanish interpreter      Patient will benefit from skilled therapeutic intervention in order to improve the following deficits and impairments:  Abnormal gait, Decreased activity tolerance, Decreased strength, Postural dysfunction, Decreased balance, Decreased mobility, Difficulty walking, Impaired sensation, Obesity  Visit Diagnosis: Difficulty in walking, not elsewhere classified  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 Difficulty in walking, not elsewhere classified   . Hemiparesis, left (HCC)   . Atypical chest pain 09/09/2016  . Hyperglycemia 09/09/2016  . Left-sided weakness   . Frequent headaches 09/08/2016  . Multiple sclerosis exacerbation (HCC) 09/08/2016  . Relapsing remitting multiple sclerosis (HCC) 10/19/2015  . Nexplanon insertion 05/15/2015  . Multiple sclerosis (HCC) 06/19/2011    4:04 PM,10/11/16 Marylyn Ishihara PT, DPT [facility] [phone]  [facility] [address] [place], [place], [address] Phone: [phone]   Fax:   [phone]  Name: [name] MRN: [id number] Date of Birth: Aug 06, 1970

## 2016-10-14 ENCOUNTER — Ambulatory Visit (HOSPITAL_COMMUNITY): Payer: BLUE CROSS/BLUE SHIELD | Admitting: Physical Therapy

## 2016-10-17 ENCOUNTER — Ambulatory Visit (HOSPITAL_COMMUNITY): Payer: BLUE CROSS/BLUE SHIELD | Admitting: Physical Therapy

## 2016-10-17 DIAGNOSIS — R262 Difficulty in walking, not elsewhere classified: Secondary | ICD-10-CM | POA: Diagnosis not present

## 2016-10-17 DIAGNOSIS — R2681 Unsteadiness on feet: Secondary | ICD-10-CM

## 2016-10-17 DIAGNOSIS — I69854 Hemiplegia and hemiparesis following other cerebrovascular disease affecting left non-dominant side: Secondary | ICD-10-CM

## 2016-10-17 NOTE — Therapy (Signed)
[facility] [address] [place], [place], [address] Phone: [phone]   Fax:  [phone]  Physical Therapy Treatment (Discharge)  Patient Details  Name: [name] MRN: [id number] Date of Birth: 13-Jun-1971 Referring Provider: Danae Chen   Encounter Date: 10/17/2016      PT End of Session - 10/17/16 1733    Visit Number 7   Number of Visits 7   Authorization Type BCBS   Authorization Time Period 09/26/16-11/07/16   PT Start Time 6962   PT Stop Time 1714   PT Time Calculation (min) 24 min   Activity Tolerance Patient tolerated treatment well   Behavior During Therapy [facility] for tasks assessed/performed      Past Medical History:  Diagnosis Date  . Headache   . Multiple sclerosis ([place]) 06/19/2011  . Multiple sclerosis ([place]) 06/19/2011    No past surgical history on file.  There were no vitals filed for this visit.      Subjective Assessment - 10/17/16 1652    Subjective Patient states that she is feeling much better, she is walking by herself and she is able to drive now too, not using the cane anymore. She is feeling very good right now, no pain, no symptoms, she can do everything she needs and wants right now. Her balance is better than last time she came and she is not having any stumbles, LOB, etc.    Patient is accompained by: Family member;Interpreter   Pertinent History MS since 2009   Limitations Other (comment);House hold activities   How long can you sit comfortably? 3/15-No tlimited at this time.    How long can you stand comfortably? 3/15- 20-30 minutes    How long can you walk comfortably? 3/15- 40 minutes or more    Patient Stated Goals improving ability to walk better.    Currently in Pain? No/denies            [facility] PT Assessment - 10/17/16 0001      Strength   Right Hip Flexion 5/5   Right Hip Extension 3+/5   Right Hip ABduction 4/5   Left Hip Flexion 5/5   Left Hip Extension  3+/5   Left Hip ABduction 4/5   Right Knee Flexion 5/5   Right Knee Extension 5/5   Left Knee Flexion 5/5   Left Knee Extension 5/5   Right Ankle Dorsiflexion 4+/5   Left Ankle Dorsiflexion 4+/5     Transfers   Five time sit to stand comments  11 seconds   UEs crossed on chest      Ambulation/Gait   Ambulation Distance (Feet) 30 Feet   Assistive device None   Gait velocity 0.62ms    Gait Comments vastly improved symmetry      High Level Balance   High Level Balance Comments SLS 10 seconds R, 6 seconds L; tandem stance 30 seconds B                              PT Education - 10/17/16 1732    Education provided Yes   Education Details DC today due to generally being at baseline status    Person(s) Educated Patient   Methods Explanation   Comprehension Verbalized understanding          PT Short Term Goals - 10/17/16 1708      PT SHORT TERM GOAL #1   Title After 3  weeks pt will demonstrate consistency and independence with her HEP to improve functional strength and mobility.    Period Weeks   Status Achieved     PT SHORT TERM GOAL #2   Title After 3 weeks patient will improve functional AMB AEB 10MWT in less than 12 seconds.    Baseline 3/15- 11.8 seconds    Time 2   Period Weeks   Status Achieved     PT SHORT TERM GOAL #3   Title After 3 weeks patient will demonstrate improved funcitonal strength and baalnce AEB 5xSTS < 20sec hands free.    Baseline 3/15- 11    Time 3   Period Weeks   Status Achieved           PT Long Term Goals - 10/17/16 1709      PT LONG TERM GOAL #1   Title After 6 weeks patient will demonstrate improved funcitonal strength and baalnce AEB 5xSTS < 16sec hands free without LOB.    Baseline 3/15- 11    Time 6   Period Weeks   Status Achieved     PT LONG TERM GOAL #2   Title After 6 weeks patient will improve functional AMB AEB 10MWT in less than 9 seconds with SPC and less than 25% asymmetry AEB clinical  judgment of therapist.    Baseline 3/15- 11.8 seconds but imporoved symmetry    Time 6   Period Weeks   Status Partially Met     PT LONG TERM GOAL #3   Title After 6 weeks pt will demonstrate Improved balance AEB compeltion of 2 of 3 of the following measures: bilat SLS>10sec, Rhomberg Eyes Closed >30esc, or BBT: >45/56.    Time 6   Period Weeks   Status On-going               Plan - 10/17/16 1733    Clinical Impression Statement Re-assessment performed today. Patient is doing excellent at the current time and reports that she has no significant functional deficits, she is able to do everything she needs and wants at this time. Examination reveals significant improvement in all objective measures however her largest remaining impairment continues to be balance, although she is no longer a high fall risk. Due to high level of function, recommend DC at this time.    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 Difficulty in walking, not elsewhere classified   . Hemiparesis, left ([place])   . Atypical chest pain 09/09/2016  . Hyperglycemia 09/09/2016  . Left-sided weakness   . Frequent headaches 09/08/2016  . Multiple sclerosis exacerbation ([place]) 09/08/2016  . Relapsing remitting multiple sclerosis ([place]) 10/19/2015  . Nexplanon  insertion 05/15/2015  . Multiple sclerosis ([place]) 06/19/2011    PHYSICAL THERAPY DISCHARGE SUMMARY  Visits from Start of Care: 7  Current functional level related to goals / functional outcomes: Patient has made excellent progress with  skilled PT services and reports she feels mostly at baseline, she has no functional concerns at this time. DC today due to high level of function/goals being met.    Remaining deficits: Functional weakness, balance deficit    Education / Equipment: DC today, observation of MS symptoms, return to PT with another acute exacerbation Plan: Patient agrees to discharge.  Patient goals were partially met. Patient is being discharged due to being pleased with the current functional level.  ?????      Deniece Ree PT, DPT [facility] [address] [place], [place], [address] Phone: [phone]   Fax:  [phone]  Name: Harlan Vinal MRN: [id number] Date of Birth: 23-Apr-1971

## 2016-10-21 ENCOUNTER — Ambulatory Visit (HOSPITAL_COMMUNITY): Payer: BLUE CROSS/BLUE SHIELD | Admitting: Physical Therapy

## 2016-10-24 ENCOUNTER — Ambulatory Visit (HOSPITAL_COMMUNITY): Payer: BLUE CROSS/BLUE SHIELD | Admitting: Physical Therapy

## 2016-10-28 ENCOUNTER — Ambulatory Visit (HOSPITAL_COMMUNITY): Payer: BLUE CROSS/BLUE SHIELD | Admitting: Physical Therapy

## 2016-10-29 ENCOUNTER — Ambulatory Visit: Payer: BLUE CROSS/BLUE SHIELD | Admitting: Neurology

## 2016-10-31 ENCOUNTER — Ambulatory Visit (INDEPENDENT_AMBULATORY_CARE_PROVIDER_SITE_OTHER): Payer: BLUE CROSS/BLUE SHIELD | Admitting: Neurology

## 2016-10-31 ENCOUNTER — Encounter: Payer: Self-pay | Admitting: Neurology

## 2016-10-31 ENCOUNTER — Ambulatory Visit (HOSPITAL_COMMUNITY): Payer: BLUE CROSS/BLUE SHIELD | Admitting: Physical Therapy

## 2016-10-31 ENCOUNTER — Other Ambulatory Visit: Payer: Self-pay | Admitting: Neurology

## 2016-10-31 VITALS — BP 108/70 | HR 85 | Ht 62.0 in | Wt 167.1 lb

## 2016-10-31 DIAGNOSIS — G35 Multiple sclerosis: Secondary | ICD-10-CM

## 2016-10-31 LAB — CBC WITH DIFFERENTIAL/PLATELET
Basophils Absolute: 68 cells/uL (ref 0–200)
Basophils Relative: 1 %
Eosinophils Absolute: 136 cells/uL (ref 15–500)
Eosinophils Relative: 2 %
HCT: 43.1 % (ref 35.0–45.0)
Hemoglobin: 14.1 g/dL (ref 11.7–15.5)
Lymphocytes Relative: 29 %
Lymphs Abs: 1972 cells/uL (ref 850–3900)
MCH: 29.8 pg (ref 27.0–33.0)
MCHC: 32.7 g/dL (ref 32.0–36.0)
MCV: 91.1 fL (ref 80.0–100.0)
MPV: 11 fL (ref 7.5–12.5)
Monocytes Absolute: 408 cells/uL (ref 200–950)
Monocytes Relative: 6 %
Neutro Abs: 4216 cells/uL (ref 1500–7800)
Neutrophils Relative %: 62 %
Platelets: 362 10*3/uL (ref 140–400)
RBC: 4.73 MIL/uL (ref 3.80–5.10)
RDW: 14.1 % (ref 11.0–15.0)
WBC: 6.8 10*3/uL (ref 3.8–10.8)

## 2016-10-31 MED ORDER — DIMETHYL FUMARATE 240 MG PO CPDR: 240.0000 mg | DELAYED_RELEASE_CAPSULE | Freq: Every day | ORAL | 0 refills | Status: DC

## 2016-10-31 NOTE — Patient Instructions (Signed)
1. Continue Tecfidera.  Check CBC with diff  2.  I want to refer you to a multiple sclerosis specialist to make sure that there isn't anything else that could be causing these events (such as migraine) 3.  Follow up afterwards.

## 2016-10-31 NOTE — Addendum Note (Signed)
Addended by: Doree Barthel on: 10/31/2016 11:37 AM   Modules accepted: Orders

## 2016-10-31 NOTE — Progress Notes (Signed)
NEUROLOGY FOLLOW UP OFFICE NOTE  [name] [id number]  HISTORY OF PRESENT ILLNESS: [name] is a 46 year old right-handed Spanish-speaking woman who follows up for multiple sclerosis.  She is accompanied by her daughter who provides some history.  Interpretor is present.  History also supplemented by hospital records.  MRI of neuroaxis from February personally reviewed.   UPDATE: She is taking Tecfidera and D3 4000 IU daily.   She was admitted to [facility] from 09/08/16 to 09/11/16 for acute onset left sided weakness of the face, arm and leg with associated numbness.  It was associated with mild headache.  MRI of brain with and without contrast revealed no acute stroke or demyelination.  The cervical and thoracic cord was normal.  MRI of lumbar spine revealed L5 disc degeneration without stenosis.  She was treated with 3 day course of Solu-Medrol for suspected MS flare.  CBC showed WBC 4.4, HGB 14.3, HCT 42.2 and PLT 347.  LFTs showed total bili 0.3, ALP 76, AST 28 and ALT 16.  B12 was 398, CK 52, and HIV negative .  She underwent rehab afterwards and it took about a month to return to baseline.   HISTORY: She was diagnosed with multiple sclerosis in 2009.  At that time, she developed right sided numbness of the face, arm and leg.  There was also associated weakness involving the arm and leg as well.  There was associated bilateral blurred vision.  There was no associated headache.  It lasted 3 or 4 days.  At first, it was believed she had a stroke.  However, MRI of the brain with and without contrast showed numerous non-specific periventricular and subcortical hyperintensities, greater than expected for age.  There was no abnormal enhancement.  She underwent lumbar puncture in February 2009.  CSF cell count was 4, protein 21, glucose 88, IgG index within normal limits and no bands.  Serum ANA and Sed Rate were negative.  She was treated in the hospital with IV  Solu-Medrol   Initially she was taking Rebif, which worked well.  However, it caused injection site reaction.  Around 2014, she was switched to Tecifedera.  She was taking Tecfedera until about late-2015.  She changed insurance and couldn't afford it.   Between 2009 and March 2016, she had 3 similar episodes, requiring IV Solu-Medrol.  The episodes presented with right sided numbness and tingling of face, arm and leg.  There is some weakness involving the arm and leg.  It was associated with slight posterior headache.     MRI of brain and cervical spine with and without contrast performed on 12/21/14 showed stable chronic cerebral white matter lesions when compared to images from 03/18/11, and no spinal cord involvement.  It shows multiple supratentorial white matter hyperintensities without evidence of acute demyelination.  It mentions that it appears stable from prior imaging from 2009.  MRI of brain with and without contrast was performed on 07/01/15 was unchanged and showed no acute demyelinating disease.  She has back pain and pain in the legs, particularly the right leg.  MRI of lumbar spine from 07/01/15 revealed degenerative disc disease at L5-S1 with mild bilateral neural foraminal stenosis.  Gabapentin made her sick.   She has no personal history of migraine but would have an occasional headache.  She has no family history of MS.  She notes depression.  She was previously on citalopram, which was helpful.  PAST MEDICAL HISTORY: Past Medical  History:  Diagnosis Date  . Headache   . Multiple sclerosis (HCC) 06/19/2011  . Multiple sclerosis (HCC) 06/19/2011    MEDICATIONS: Current Outpatient Prescriptions on File Prior to Visit  Medication Sig Dispense Refill  . Dimethyl Fumarate (TECFIDERA) 240 MG CPDR Take 240 mg by mouth daily.     Marland Kitchen etonogestrel (NEXPLANON) 68 MG IMPL implant 1 each by Subdermal route once.    Marland Kitchen aspirin EC 325 MG tablet Take 325-650 mg by mouth daily as needed for  mild pain.     No current facility-administered medications on file prior to visit.     ALLERGIES: No Known Allergies  FAMILY HISTORY: Family History  Problem Relation Age of Onset  . Hypertension Mother     SOCIAL HISTORY: Social History   Social History  . Marital status: Married    Spouse name: N/A  . Number of children: N/A  . Years of education: N/A   Occupational History  . Not on file.   Social History Main Topics  . Smoking status: Never Smoker  . Smokeless tobacco: Never Used  . Alcohol use No  . Drug use: No  . Sexual activity: Yes    Partners: Male    Birth control/ protection: Implant   Other Topics Concern  . Not on file   Social History Narrative  . No narrative on file    REVIEW OF SYSTEMS: Constitutional: No fevers, chills, or sweats, no generalized fatigue, change in appetite Eyes: No visual changes, double vision, eye pain Ear, nose and throat: No hearing loss, ear pain, nasal congestion, sore throat Cardiovascular: No chest pain, palpitations Respiratory:  No shortness of breath at rest or with exertion, wheezes GastrointestinaI: No nausea, vomiting, diarrhea, abdominal pain, fecal incontinence Genitourinary:  No dysuria, urinary retention or frequency Musculoskeletal:  No neck pain, back pain Integumentary: No rash, pruritus, skin lesions Neurological: as above Psychiatric: No depression, insomnia, anxiety Endocrine: No palpitations, fatigue, diaphoresis, mood swings, change in appetite, change in weight, increased thirst Hematologic/Lymphatic:  No purpura, petechiae. Allergic/Immunologic: no itchy/runny eyes, nasal congestion, recent allergic reactions, rashes  PHYSICAL EXAM: Vitals:   10/31/16 0852  BP: 108/70  Pulse: 85   General: No acute distress.  Patient appears well-groomed.  normal body habitus. Head:  Normocephalic/atraumatic Eyes:  Fundi examined but not visualized Neck: supple, no paraspinal tenderness, full range of  motion Heart:  Regular rate and rhythm Lungs:  Clear to auscultation bilaterally Back: No paraspinal tenderness Neurological Exam: alert and oriented to person, place, and time. Attention span and concentration intact, recent and remote memory intact, fund of knowledge intact.  Speech fluent and not dysarthric, language intact.  CN II-XII intact. Bulk and tone normal, muscle strength 4+ left deltoid and hip flexion, otherwise 5-/5 on left.  5/5 on right. Sensation to pinprick and vibration sensitive in left arm and leg.  Deep tendon reflexes 3+ throughout but slightly more risk in left BR and biceps, toes downgoing.  Finger to nose intact.  Gait with right limp. Romberg negative.  IMPRESSION: Relapsing-remitting multiple sclerosis.  I wonder if she is experiencing hemiplegic migraine.  Other flare ups were reportedly associated with mild headache.  She has abnormal lesions in the cerebral white matter, but no infratentorial lesions, including the spinal cord.  Despite this flare-up with significant symptoms (hemiplegia), the MRI showed no new or acute lesion when compared to prior studies from 2016.  Also, CSF results from 2009 were unremarkable.  She continues to have unilateral weakness but  I wonder how much may be give-way weakness.  PLAN: 1.  I will refer her to Dr. Despina Arias, a multiple sclerosis specialist, for a second opinion. 2.  Continue D3.  We will continue Tecfidera for now.  If Dr. Epimenio Foot believes she has MS, then I will likely switch her to a different disease modifying agent.  3.  Check CBC with diff. 4.  Follow up after evaluation with Dr. Epimenio Foot.  26 minutes spent face to face with patient, over 50% spent discussing test results and possible etiologies.  Shon Millet, DO  CC:  Maryelizabeth Rowan, MD

## 2016-11-04 ENCOUNTER — Encounter: Payer: Self-pay | Admitting: Neurology

## 2016-11-04 ENCOUNTER — Ambulatory Visit (INDEPENDENT_AMBULATORY_CARE_PROVIDER_SITE_OTHER): Payer: BLUE CROSS/BLUE SHIELD | Admitting: Neurology

## 2016-11-04 DIAGNOSIS — R9089 Other abnormal findings on diagnostic imaging of central nervous system: Secondary | ICD-10-CM | POA: Insufficient documentation

## 2016-11-04 DIAGNOSIS — F32A Depression, unspecified: Secondary | ICD-10-CM

## 2016-11-04 DIAGNOSIS — G4700 Insomnia, unspecified: Secondary | ICD-10-CM | POA: Diagnosis not present

## 2016-11-04 DIAGNOSIS — R269 Unspecified abnormalities of gait and mobility: Secondary | ICD-10-CM

## 2016-11-04 DIAGNOSIS — F329 Major depressive disorder, single episode, unspecified: Secondary | ICD-10-CM

## 2016-11-04 DIAGNOSIS — R200 Anesthesia of skin: Secondary | ICD-10-CM

## 2016-11-04 NOTE — Progress Notes (Signed)
[facility]  PATIENT: Tonna Palazzi DOB: [birth year]  REFERRING DOCTOR OR PCP:  Shon Millet SOURCE: patient, daughter, MRi images on PACS, Lab and imaging reports, notes from Dr. Everlena Cooper.  _________________________________   HISTORICAL  CHIEF COMPLAINT:  Chief Complaint  Patient presents with  . Abnormal MRI    Teandra is here with her dtr. Aram Beecham for 46nd opinion regarding MS dx.  Sts. she was dx. in 2009.  Sts. in 2009 she was seen in the ER at [facility] for right sided numbness.  Dtr. sts.pt. had MRI brain and LP and then was dx. with MS. Saw Dr. Gerilyn Pilgrim and started Rebif, but due to skin rxns, was inconsistent in taking it. Switched to [employer] 2 yrs. ago.  Sts. since dx. she has had about 3 other episodes of numbness.  Last time was one month ago  Seen at [facility]  . Numbness    for left sided numbness, admitted for 3-4 days for IV steroids.  Numbness resolved about 2 weeks later. Sts. only other sx. she's ever had is intermittent blurry vison.No family hx. of MS.  Has been seeing Dr. Everlena Cooper for the last 1-2 yrs. and he is questioning MS dx./fim    HISTORY OF PRESENT ILLNESS:  I had the pleasure of seeing your patient,Emilia nilsa macht, at [facility] for neurologic consultation regarding her diagnosis of multiple sclerosis.    In February 2009, she presented to the emergency room at [facility] with right sided numbness in the hospital she had an MRI of the brain and also had a lumbar puncture the MRI of the brain showed multiple T2/FLAIR hyperintense foci, predominantly in the subcortical and deep white matter which would be in a nonspecific pattern. The cerebrospinal fluid showed normal IgG index and no oligoclonal bands. She was diagnosed with multiple sclerosis and started on Rebif. She had some noncompliance due to skin reactions and was switched to Tecfidera about 2 years ago.  She tolerates Tecfidera  well.  Over the last 9 years she has had several episodes of numbness or mild weakness for a couple days, all with him treated with several days of IV steroids.  Most recently, about 8 weeks ago, she was seen at [facility] for left-sided numbness and received IV steroids as an inpatient followed by outpatient physical therapy.   She had MRI of the brain and cervical spine while in the hospital. There were no new lesions in the brain and the spinal cord was normal.   She is referred for second opinion regarding the diagnosis of multiple sclerosis.      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   Foraminal narrowing leads to encroachment upon the left L5 nerve root without definite nerve root compression).    She denies any weakness in her arms or legs. She does note mild reduced sensation on the right.   There is no ataxia.  She denies urinary frequency or urgency. She has nocturia once a night. She notes occasional blurry vision but no constant change in her vision. There is no diplopia.  She notes mild fatigue. She has difficulty with both sleep onset and sleep maintenance insomnia, some nights worse than others. She denies any cognitive problems. She does note mild depression and is on citalopram.  She does not have any vascular risk factors. Specifically, there  is no diabetes, hypertension or smoking.  I personally reviewed MRIs of the brain, cervical spine and thoracic spine as follows: 09/10/2016: MRI of the cervical spine shows normal spinal cord and minimal degenerative changes. 09/10/2016: MRI of the lumbar spine shows anterolisthesis of L5 upon S1 associated with pars defects. There is moderate left foraminal narrowing and mild to moderate right lateral recess stenosis. There is some encroachment upon the left L5 nerve root but no definite nerve root compression. 09/09/2016: MRI of  the brain shows multiple T2/FLAIR hyperintense foci in the subcortical and deep white matter of both hemispheres. The pattern is nonspecific. There do not appear to be any new lesions when compared to the earlier MRI dated 07/01/2015 (note that the 2016 MRI is performed on a 3T magnet and more lesions are evident) MRI of the thoracic spine 09/10/2016 showed a normal spinal cord. MRIs of the brain from 07/01/2015, and 09/22/2007 were also reviewed facial T2/FLAIR hyperintense foci in the subcortical and deep white matter.    None of the brain MRI showed any acute findings.  Lab results were reviewed. CSF from 09/24/2007 showed a normal IgG index of 0.58. There were no oligoclonal bands.  REVIEW OF SYSTEMS: Constitutional: No fevers, chills, sweats, or change in appetite.  Notes fatigue and insomnia Eyes: No visual changes, double vision, eye pain Ear, nose and throat: No hearing loss, ear pain, nasal congestion, sore throat Cardiovascular: No chest pain, palpitations Respiratory: No shortness of breath at rest or with exertion.   No wheezes GastrointestinaI: No nausea, vomiting, diarrhea, abdominal pain, fecal incontinence Genitourinary: No dysuria, urinary retention or frequency.  No nocturia. Musculoskeletal: No neck pain, back pain Integumentary: No rash, pruritus, skin lesions Neurological: as above Psychiatric: Mild depression at this time.  Hematologic/Lymphatic: No anemia, purpura, petechiae. Allergic/Immunologic: No itchy/runny eyes, nasal congestion, recent allergic reactions, rashes  ALLERGIES: No Known Allergies  HOME MEDICATIONS:  Current Outpatient Prescriptions:  .  Dimethyl Fumarate (TECFIDERA) 240 MG CPDR, Take 1 capsule (240 mg total) by mouth daily., Disp: 90 capsule, Rfl: 0 .  etonogestrel (NEXPLANON) 68 MG IMPL implant, 1 each by Subdermal route once., Disp: , Rfl:  .  aspirin EC 325 MG tablet, Take 325-650 mg by mouth daily as needed for mild pain., Disp: , Rfl:    PAST MEDICAL HISTORY: Past Medical History:  Diagnosis Date  . Headache   . Multiple sclerosis (HCC) 06/19/2011  . Multiple sclerosis (HCC) 06/19/2011  . Vision abnormalities     PAST SURGICAL HISTORY: No past surgical history on file.  FAMILY HISTORY: Family History  Problem Relation Age of Onset  . Hypertension Mother   . Healthy Father     SOCIAL HISTORY:  Social History   Social History  . Marital status: Married    Spouse name: N/A  . Number of children: N/A  . Years of education: N/A   Occupational History  . Not on file.   Social History Main Topics  . Smoking status: Never Smoker  . Smokeless tobacco: Never Used  . Alcohol use No  . Drug use: No  . Sexual activity: Yes    Partners: Male    Birth control/ protection: Implant   Other Topics Concern  . Not on file   Social History Narrative  . No narrative on file     PHYSICAL EXAM  Vitals:   11/04/16 1339  BP: 115/71  Pulse: 77  Resp: (!) 6  Weight: 166 lb 8 oz (75.5 kg)  Height:  5\' 2"  (1.575 m)    Body mass index is 30.45 kg/m.   General: The patient is well-developed and well-nourished and in no acute distress  Eyes:  Funduscopic exam shows normal optic discs and retinal vessels.  Neck: The neck is supple, no carotid bruits are noted.  The neck is nontender.  Cardiovascular: The heart has a regular rate and rhythm with a normal S1 and S2. There were no murmurs, gallops or rubs. Lungs are clear to auscultation.  Skin: Extremities are without significant edema.  Musculoskeletal:  Back is nontender  Neurologic Exam  Mental status: The patient is alert and oriented x 3 at the time of the examination. The patient has apparent normal recent and remote memory, with an apparently normal attention span and concentration ability.   Speech is normal.  Cranial nerves: Extraocular movements are full. Pupils are equal, round, and reactive to light and accomodation.  Visual fields are full.   Facial symmetry is present. There is good facial sensation to soft touch bilaterally.Facial strength is normal.  Trapezius and sternocleidomastoid strength is normal. No dysarthria is noted.  The tongue is midline, and the patient has symmetric elevation of the soft palate. No obvious hearing deficits are noted.  Motor:  Muscle bulk is normal.   Tone is normal. Strength is  5 / 5 in all 4 extremities.   Sensory: Sensory testing is intact to pinprick, soft touch and vibration sensation in all 4 extremities.  Coordination: Cerebellar testing reveals good finger-nose-finger and heel-to-shin bilaterally.  Gait and station: Station is normal.   Gait is arthritic. Tandem gait is mildly wide. Romberg is negative.   Reflexes: Deep tendon reflexes are symmetric and normal in arms, 3+ at tht knees (no spread) and 2+ at the ankles (no clonus).   Plantar responses are flexor.    DIAGNOSTIC DATA (LABS, IMAGING, TESTING) - I reviewed patient records, labs, notes, testing and imaging myself where available.  Lab Results  Component Value Date   WBC 4.4 09/09/2016   HGB 14.3 09/09/2016   HCT 42.2 09/09/2016   MCV 89.6 09/09/2016   PLT 347 09/09/2016      Component Value Date/Time   NA 138 09/11/2016 0422   K 3.9 09/11/2016 0422   CL 107 09/11/2016 0422   CO2 20 (L) 09/11/2016 0422   GLUCOSE 151 (H) 09/11/2016 0422   BUN 25 (H) 09/11/2016 0422   CREATININE 0.54 09/11/2016 0422   CALCIUM 8.5 (L) 09/11/2016 0422   PROT 7.1 09/09/2016 0545   ALBUMIN 3.5 09/09/2016 0545   AST 28 09/09/2016 0545   ALT 16 09/09/2016 0545   ALKPHOS 76 09/09/2016 0545   BILITOT 0.3 09/09/2016 0545   GFRNONAA >60 09/11/2016 0422   GFRAA >60 09/11/2016 0422    Lab Results  Component Value Date   HGBA1C 5.6 09/09/2016   Lab Results  Component Value Date   VITAMINB12 398 09/09/2016   Lab Results  Component Value Date   TSH 1.399 09/08/2016       ASSESSMENT AND PLAN  Abnormal brain  MRI  Numbness  Gait disturbance  Depression, unspecified depression type  Insomnia, unspecified type   In summary, Ms. Logan Vegh is a 46 year old woman with an abnormal brain MRI who was diagnosed with MS in 2009 after presenting with numbness. I personally reviewed brain MRI's and spine MRIs performed over the last 9 years. The MRI of the brain is abnormal showing multiple white matter foci. However, the configuration of the  lesions and the pattern is nonspecific.   The MRIs of the cervical and thoracic spine did not show any spinal cord plaques. Her CSF was normal.    I think the likelihood that she has MS is low and the abnormal brain MRI likely represents chronic microvascular ischemic changes though the etiology is uncertain. A very mild MS is certainly possible and cannot be completely ruled out.    Her mild gait issues could be due to her degenerative changes at L5-S1. I do not have a good explanation for her episodes of numbness.  I recommend that she be tried off of a disease modifying therapy. After she is off therapy for a year, consider a repeat MRI of the brain to see if there has been changes over time and would be more consistent with MS.  Hopefully, she will be able to remain off on any disease modifying therapy.    No follow-up appointment was made but I would be happy to see her back in the future if you or the patient desire.  Thank you for asking me to see Ms. Britt Bolognese Rangel for a neurologic consultation. Please let me know if I can be of assistance with her or other patients in the future.   Kewana Sanon A. Epimenio Foot, MD, PhD 11/04/2016, 1:56 PM Certified in Neurology, Clinical Neurophysiology, Sleep Medicine, Pain Medicine and Neuroimaging  [facility] [address], [address] [place], [place] [address] [phone]

## 2016-11-04 NOTE — Addendum Note (Signed)
Addended by: Despina Arias A on: 11/04/2016 02:55 PM   Modules accepted: Level of Service

## 2016-11-05 ENCOUNTER — Telehealth: Payer: Self-pay

## 2016-11-05 NOTE — Telephone Encounter (Signed)
Called daughter to explain could set up an appt to discuss in dx in detail along w/ med management in OV per Dr. Everlena Cooper. No answer. Will try later.

## 2016-11-06 NOTE — Telephone Encounter (Signed)
-----   Message from Drema Dallas, DO sent at 11/05/2016  7:12 AM EDT ----- I received Dr. Bonnita Hollow note and it appears that he agrees that [facility] likely does not have multiple sclerosis.  I would like her to follow up to discuss further.  The recommendation would be to discontinue the Tecfidera and repeat MRI of brain with and without contrast in one year.  However, if she makes a follow up appointment, we can discuss further.

## 2016-11-06 NOTE — Telephone Encounter (Signed)
Spoke to daughter. Gave instructions. Daughter was grateful and verbalized understanding. Dghter was driving. Will c/b to schedule f/u appt.

## 2016-11-13 ENCOUNTER — Telehealth: Payer: Self-pay

## 2016-11-14 NOTE — Telephone Encounter (Signed)
Called daughter Cinthya. No answer. VM full.

## 2016-11-15 ENCOUNTER — Other Ambulatory Visit: Payer: Self-pay | Admitting: Family Medicine

## 2016-11-15 DIAGNOSIS — Z1231 Encounter for screening mammogram for malignant neoplasm of breast: Secondary | ICD-10-CM

## 2016-12-06 ENCOUNTER — Ambulatory Visit: Payer: Self-pay

## 2016-12-25 ENCOUNTER — Telehealth: Payer: Self-pay | Admitting: Neurology

## 2016-12-25 NOTE — Telephone Encounter (Signed)
Appt scheduled with Dr. Everlena Cooper for June 21.

## 2016-12-25 NOTE — Telephone Encounter (Signed)
-----   Message from Drema Dallas, DO sent at 12/25/2016  6:50 AM EDT ----- Last month, we suspected that [name] may not have MS and recommended to stop Tecfidera and repeat MRI of brain with and without contrast in one year (with follow up with me afterwards).  I don't see any documentation in the chart regarding this, and it appears she does not have repeat MRI and follow up with me scheduled in one year.  Could we please contact the patient (will need to speak with daughter as she is Spanish-speaking) and set this up?  Also, please document this message into the chart.  Thank you.

## 2017-01-23 ENCOUNTER — Encounter: Payer: Self-pay | Admitting: Neurology

## 2017-01-23 ENCOUNTER — Ambulatory Visit (INDEPENDENT_AMBULATORY_CARE_PROVIDER_SITE_OTHER): Payer: BLUE CROSS/BLUE SHIELD | Admitting: Neurology

## 2017-01-23 VITALS — BP 102/62 | HR 60 | Ht 62.0 in | Wt 170.0 lb

## 2017-01-23 DIAGNOSIS — G43409 Hemiplegic migraine, not intractable, without status migrainosus: Secondary | ICD-10-CM

## 2017-01-23 DIAGNOSIS — M5441 Lumbago with sciatica, right side: Secondary | ICD-10-CM | POA: Diagnosis not present

## 2017-01-23 DIAGNOSIS — M5442 Lumbago with sciatica, left side: Secondary | ICD-10-CM

## 2017-01-23 DIAGNOSIS — G8929 Other chronic pain: Secondary | ICD-10-CM | POA: Diagnosis not present

## 2017-01-23 MED ORDER — TIZANIDINE HCL 2 MG PO TABS: 2.0000 mg | ORAL_TABLET | Freq: Four times a day (QID) | ORAL | 4 refills | Status: DC | PRN

## 2017-01-23 NOTE — Patient Instructions (Signed)
I think you may have migraine but I don't think it is multiple sclerosis. You have stopped the Tecfidera.  We will repeat MRI of brain with and without contrast in April 2019.  Follow up with me afterwards.  For back pain, I will prescribe you tizanidine 2mg  every 6 hours as needed.  Caution for drowsiness.

## 2017-01-23 NOTE — Progress Notes (Signed)
NEUROLOGY FOLLOW UP OFFICE NOTE  [name] [id number]  HISTORY OF PRESENT ILLNESS: [name] is a 46 year old right-handed Spanish-speaking woman who follows up for multiple sclerosis.  She is accompanied by her daughter who provides some history.  Interpretor is present.     UPDATE: I questioned the diagnosis of MS and referred her to Dr. Despina Arias, an MS specialist for consult.  He believed that the likelihood that she has MS is low, although not ruled out, and that the MRI findings are likely related to chronic small vessel ischemic changes of uncertain etiology.  He recommended discontinuing disease modifying therapy and repeat MRI in a year to evaluate for any progression consistent with MS.  She discontinued Tecfidera in April.  She has not had any recurrent relapse of left sided weakness.  She reports continued back pain radiating into both hips.   HISTORY: She was diagnosed with multiple sclerosis in 2009.  At that time, she developed right sided numbness of the face, arm and leg.  There was also associated weakness involving the arm and leg as well.  There was associated bilateral blurred vision.  There was no associated headache.  It lasted 3 or 4 days.  At first, it was believed she had a stroke.  However, MRI of the brain with and without contrast showed numerous non-specific periventricular and subcortical hyperintensities, greater than expected for age.  There was no abnormal enhancement.  She underwent lumbar puncture in February 2009.  CSF cell count was 4, protein 21, glucose 88, IgG index within normal limits and no bands.  Serum ANA and Sed Rate were negative.  She was treated in the hospital with IV Solu-Medrol   Initially she was taking Rebif, which worked well.  However, it caused injection site reaction.  Around 2014, she was switched to Tecifedera.  She was taking Tecfedera until about late-2015.  She changed insurance and couldn't afford  it.   Between 2009 and March 2016, she had 3 similar episodes, requiring IV Solu-Medrol.  The episodes presented with right sided numbness and tingling of face, arm and leg.  There is some weakness involving the arm and leg.  It was associated with slight posterior headache.  In February 2018, she was hospitalized for acute onset left sided numbness and weakness of face, arm and leg associated with mild headache.  MRI of brain with and without contrast revealed no acute stroke or demyelination.  The cervical and thoracic cord was normal.  MRI of lumbar spine revealed L5 disc degeneration without stenosis.  She was treated with 3 day course of Solu-Medrol for suspected MS flare.   MRI of brain and cervical spine with and without contrast performed on 12/21/14 showed stable chronic cerebral white matter lesions when compared to images from 03/18/11, and no spinal cord involvement.  It shows multiple supratentorial white matter hyperintensities without evidence of acute demyelination.  It mentions that it appears stable from prior imaging from 2009.  MRI of brain with and without contrast was performed on 07/01/15 was unchanged and showed no acute demyelinating disease.   She has back pain and pain in the legs, particularly the right leg.  MRI of lumbar spine from 07/01/15 revealed degenerative disc disease at L5-S1 with mild bilateral neural foraminal stenosis.  Gabapentin made her sick.   She has no personal history of migraine but would have an occasional headache.  She has no family history of MS.  She notes  depression.  She was previously on citalopram, which was helpful.  PAST MEDICAL HISTORY: Past Medical History:  Diagnosis Date  . Headache   . Multiple sclerosis (HCC) 06/19/2011  . Multiple sclerosis (HCC) 06/19/2011  . Vision abnormalities     MEDICATIONS: Current Outpatient Prescriptions on File Prior to Visit  Medication Sig Dispense Refill  . etonogestrel (NEXPLANON) 68 MG IMPL implant  1 each by Subdermal route once.     No current facility-administered medications on file prior to visit.     ALLERGIES: No Known Allergies  FAMILY HISTORY: Family History  Problem Relation Age of Onset  . Hypertension Mother   . Healthy Father     SOCIAL HISTORY: Social History   Social History  . Marital status: Married    Spouse name: N/A  . Number of children: N/A  . Years of education: N/A   Occupational History  . Not on file.   Social History Main Topics  . Smoking status: Never Smoker  . Smokeless tobacco: Never Used  . Alcohol use No  . Drug use: No  . Sexual activity: Yes    Partners: Male    Birth control/ protection: Implant   Other Topics Concern  . Not on file   Social History Narrative  . No narrative on file    REVIEW OF SYSTEMS: Constitutional: No fevers, chills, or sweats, no generalized fatigue, change in appetite Eyes: No visual changes, double vision, eye pain Ear, nose and throat: No hearing loss, ear pain, nasal congestion, sore throat Cardiovascular: No chest pain, palpitations Respiratory:  No shortness of breath at rest or with exertion, wheezes GastrointestinaI: No nausea, vomiting, diarrhea, abdominal pain, fecal incontinence Genitourinary:  No dysuria, urinary retention or frequency Musculoskeletal:  No neck pain, back pain Integumentary: No rash, pruritus, skin lesions Neurological: as above Psychiatric: No depression, insomnia, anxiety Endocrine: No palpitations, fatigue, diaphoresis, mood swings, change in appetite, change in weight, increased thirst Hematologic/Lymphatic:  No purpura, petechiae. Allergic/Immunologic: no itchy/runny eyes, nasal congestion, recent allergic reactions, rashes  PHYSICAL EXAM: Vitals:   01/23/17 0831  BP: 102/62  Pulse: 60   General: No acute distress.  Patient appears well-groomed.  Head:  Normocephalic/atraumatic Back:  Bilateral paraspinal tenderness  IMPRESSION: 1.  At this point, MS  not suspected.  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  Chronic low back pain.  MRI reveals no stenosis.  PLAN: 1.  Tizanidine as needed for back pain 2.  Repeat MRI of brain with and without contrast in April 2019. 3.  Follow up soon after MRI.  18 minutes spent face to face with patient, 100% spent discussing impression and plan.  Shon Millet, DO  CC:  Maryelizabeth Rowan, MD

## 2017-12-18 ENCOUNTER — Other Ambulatory Visit: Payer: Self-pay | Admitting: Family Medicine

## 2017-12-18 DIAGNOSIS — R109 Unspecified abdominal pain: Secondary | ICD-10-CM

## 2017-12-26 ENCOUNTER — Ambulatory Visit
Admission: RE | Admit: 2017-12-26 | Discharge: 2017-12-26 | Disposition: A | Discharge status: Home / Self Care | Payer: BLUE CROSS/BLUE SHIELD | Source: Ambulatory Visit | Attending: Family Medicine | Admitting: Family Medicine

## 2017-12-26 ENCOUNTER — Other Ambulatory Visit: Payer: Self-pay | Admitting: Family Medicine

## 2017-12-26 DIAGNOSIS — M1710 Unilateral primary osteoarthritis, unspecified knee: Secondary | ICD-10-CM

## 2017-12-26 DIAGNOSIS — R109 Unspecified abdominal pain: Secondary | ICD-10-CM

## 2017-12-26 DIAGNOSIS — M179 Osteoarthritis of knee, unspecified: Secondary | ICD-10-CM

## 2018-01-20 ENCOUNTER — Other Ambulatory Visit: Payer: Self-pay | Admitting: Family Medicine

## 2018-01-20 DIAGNOSIS — Z1231 Encounter for screening mammogram for malignant neoplasm of breast: Secondary | ICD-10-CM

## 2018-05-29 ENCOUNTER — Encounter: Payer: Self-pay | Admitting: Women's Health

## 2018-05-29 ENCOUNTER — Encounter (INDEPENDENT_AMBULATORY_CARE_PROVIDER_SITE_OTHER): Payer: Self-pay

## 2018-05-29 ENCOUNTER — Ambulatory Visit (INDEPENDENT_AMBULATORY_CARE_PROVIDER_SITE_OTHER): Payer: Commercial Managed Care - PPO | Admitting: Women's Health

## 2018-05-29 VITALS — BP 117/80 | HR 81 | Ht <= 58 in | Wt 173.6 lb

## 2018-05-29 DIAGNOSIS — Z3049 Encounter for surveillance of other contraceptives: Secondary | ICD-10-CM | POA: Diagnosis not present

## 2018-05-29 DIAGNOSIS — Z3202 Encounter for pregnancy test, result negative: Secondary | ICD-10-CM

## 2018-05-29 DIAGNOSIS — Z30017 Encounter for initial prescription of implantable subdermal contraceptive: Secondary | ICD-10-CM

## 2018-05-29 DIAGNOSIS — Z3046 Encounter for surveillance of implantable subdermal contraceptive: Secondary | ICD-10-CM

## 2018-05-29 LAB — POCT URINE PREGNANCY: Preg Test, Ur: NEGATIVE

## 2018-05-29 MED ORDER — ETONOGESTREL 68 MG ~~LOC~~ IMPL
68.0000 mg | DRUG_IMPLANT | Freq: Once | SUBCUTANEOUS | Status: AC
  Administered 2018-05-29: 68 mg via SUBCUTANEOUS

## 2018-05-29 NOTE — Addendum Note (Signed)
Addended by: Federico Flake A on: 05/29/2018 01:22 PM   Modules accepted: Orders

## 2018-05-29 NOTE — Progress Notes (Signed)
   NEXPLANON REMOVAL AND RE-INSERTION Patient name: [name] MRN [id number]  Date of birth: 08/18/1970 Subjective Findings:   [name] is a 47 y.o. G6P0 Hispanic female being seen today for Nexplanon removal and re-insertion. Her Nexplanon was placed 05/15/15.   No LMP recorded. Patient has had an implant. Last pap this year w/ PCP in [place]. Results were:  normal  Risks/benefits/side effects of Nexplanon have been discussed and her questions have been answered.  Specifically, a failure rate of 08/998 has been reported, with an increased failure rate if pt takes St. John's Wort and/or antiseizure medicaitons.  She is aware of the common side effect of irregular bleeding, which the incidence of decreases over time. Signed copy of informed consent in chart.  Pertinent History Reviewed:   Reviewed past medical,surgical, social, obstetrical and family history.  Reviewed problem list, medications and allergies. Objective Findings & Procedure:    Vitals:   05/29/18 1241  BP: 117/80  Pulse: 81  Weight: 173 lb 9.6 oz (78.7 kg)  Height: 4\' 10"  (1.473 m)  Body mass index is 36.28 kg/m.  Results for orders placed or performed in visit on 05/29/18 (from the past 24 hour(s))  POCT urine pregnancy   Collection Time: 05/29/18 12:55 PM  Result Value Ref Range   Preg Test, Ur Negative Negative     Time out was performed.  Nexplanon site identified.  Area prepped in usual sterile fashon. Two cc's of 2% lidocaine was used to anesthetize the area. A small stab incision was made right beside the implant on the distal portion.  The Nexplanon rod was grasped using hemostats and removed intact without difficulty.  The area was cleansed again with betadine and the Nexplanon was inserted approximately 10cm from the medial epicondyle and 3-5cm posterior to the sulcus per manufacturer's recommendations without difficulty.  Steri-strips and a pressure bandage was applied.  There was  less than 3 cc blood loss. There were no complications.  The patient tolerated the procedure well. Assessment & Plan:   1) Nexplanon removal & re-insertion She was instructed to keep the area clean and dry, remove pressure bandage in 24 hours, and keep insertion site covered with the steri-strips for 3-5 days.  She was given a card indicating date Nexplanon was inserted and date it needs to be removed.  Follow-up PRN problems.  Orders Placed This Encounter  Procedures  . POCT urine pregnancy    Follow-up: Return for prn.  Cheral Marker CNM, [facility] 05/29/2018 1:17 PM

## 2018-05-29 NOTE — Patient Instructions (Signed)

## 2018-06-09 DIAGNOSIS — E559 Vitamin D deficiency, unspecified: Secondary | ICD-10-CM | POA: Diagnosis not present

## 2018-06-12 DIAGNOSIS — E559 Vitamin D deficiency, unspecified: Secondary | ICD-10-CM | POA: Diagnosis not present

## 2018-06-12 DIAGNOSIS — Z8673 Personal history of transient ischemic attack (TIA), and cerebral infarction without residual deficits: Secondary | ICD-10-CM | POA: Diagnosis not present

## 2018-06-12 DIAGNOSIS — G35 Multiple sclerosis: Secondary | ICD-10-CM | POA: Diagnosis not present

## 2018-09-11 DIAGNOSIS — I10 Essential (primary) hypertension: Secondary | ICD-10-CM | POA: Diagnosis not present

## 2018-09-11 DIAGNOSIS — R42 Dizziness and giddiness: Secondary | ICD-10-CM | POA: Diagnosis not present

## 2018-09-11 DIAGNOSIS — G35 Multiple sclerosis: Secondary | ICD-10-CM | POA: Diagnosis not present

## 2018-09-18 DIAGNOSIS — R42 Dizziness and giddiness: Secondary | ICD-10-CM | POA: Diagnosis not present

## 2018-09-18 DIAGNOSIS — Z6831 Body mass index (BMI) 31.0-31.9, adult: Secondary | ICD-10-CM | POA: Diagnosis not present

## 2018-10-02 DIAGNOSIS — R51 Headache: Secondary | ICD-10-CM | POA: Diagnosis not present

## 2018-10-02 DIAGNOSIS — I10 Essential (primary) hypertension: Secondary | ICD-10-CM | POA: Diagnosis not present

## 2018-10-13 NOTE — Progress Notes (Signed)
NEUROLOGY FOLLOW UP OFFICE NOTE  [name] [id number]  HISTORY OF PRESENT ILLNESS: [name] is a 48 year old right-handed Spanish-speaking woman who follows up for questionable multiple sclerosis.  She is accompanied by her daughter who supplements history.  Interpreter is present.  UPDATE: Patient last seen in June 2018.  At that time, it was determined that she did not meet criteria for multiple sclerosis and my assessment was that her symptoms were actually migraines.  Recommended repeating MRI in a year.  Tecfidera was discontinued in April 2018.   She reports headaches and at night time she has pain in the legs.  She has a constant throbbing holocephalic headache daily  She has associated nausea, photophobia, phonophobia and blurred vision.  She takes Advil most days out of the week which helps relieve the pain.  She has had an eye exam with no significant findings.  Last Sunday, she was unable to walk on the right leg due to pain.  HISTORY: She was diagnosed with multiple sclerosis in 2009. At that time, she developed right sided numbness of the face, arm and leg. There was also associated weakness involving the arm and leg as well. There was associated bilateral blurred vision. There was no associated headache. It lasted 3 or 4 days. At first, it was believed she had a stroke. However, MRI of the brain with and without contrast showed numerous non-specific periventricular and subcortical hyperintensities, greater than expected for age. There was no abnormal enhancement. She underwent lumbar puncture in February 2009. CSF cell count was 4, protein 21, glucose 88, IgG index within normal limits and no bands. Serum ANA and Sed Rate were negative. She was treated in the hospital with IV Solu-Medrol  Initially she was taking Rebif, which worked well. However, it caused injection site reaction. Around 2014, she was switched to Tecifedera. She was  taking Tecfedera until about late-2015. She changed insurance and couldn't afford it.  Between 2009 and March 2016, she had 3 similar episodes, requiring IV Solu-Medrol. The episodes presented with right sided numbness and tingling of face, arm and leg. There is some weakness involving the arm and leg. It was associated with slight posterior headache. In February 2018, she was hospitalized for acute onset left sided numbness and weakness of face, arm and leg associated with mild headache.  MRI of brain with and without contrast revealed no acute stroke or demyelination.  The cervical and thoracic cord was normal.  MRI of lumbar spine revealed L5 disc degeneration without stenosis.  She was treated with 3 day course of Solu-Medrol for suspected MS flare.  MRI of brain and cervical spine with and without contrast performed on 12/21/14 showed stable chronic cerebral white matter lesions when compared to images from 03/18/11, and no spinal cord involvement.  It shows multiple supratentorial white matter hyperintensities without evidence of acute demyelination. It mentions that it appears stable from prior imaging from 2009.  MRI of brain with and without contrast was performed on 07/01/15 was unchanged and showed no acute demyelinating disease.    She was subsequently evaluated by multiple sclerosis specialist Dr. Despina Arias in 2018 who believed at the time that the likelihood that she had MS was low, although not ruled out, and that the MRI findings were likely related to chronic small vessel ischemic changes of uncertain etiology.  It was recommended to discontinue disease modifying therapy and repeat MRI in a year to evaluate for any progression consistent with MS.  Tecfidera was discontinued in April 2018.  She has back pain and pain in the legs, particularly the right leg.  MRI of lumbar spine from 07/01/15 revealed degenerative disc disease at L5-S1 with mild bilateral neural foraminal stenosis.   Gabapentin made her sick.  She has no personal history of migraine but would have an occasional headache. She has no family history of MS. She notes depression. She was previously on citalopram, which was helpful.  PAST MEDICAL HISTORY: Past Medical History:  Diagnosis Date  . Headache   . Multiple sclerosis (HCC) 06/19/2011  . Multiple sclerosis (HCC) 06/19/2011  . Vision abnormalities     MEDICATIONS: Current Outpatient Medications on File Prior to Visit  Medication Sig Dispense Refill  . etonogestrel (NEXPLANON) 68 MG IMPL implant 1 each by Subdermal route once.    Marland Kitchen tiZANidine (ZANAFLEX) 2 MG tablet Take 1 tablet (2 mg total) by mouth every 6 (six) hours as needed for muscle spasms. 30 tablet 4   No current facility-administered medications on file prior to visit.     ALLERGIES: No Known Allergies  FAMILY HISTORY: Family History  Problem Relation Age of Onset  . Hypertension Mother   . Healthy Father    SOCIAL HISTORY: Social History   Socioeconomic History  . Marital status: Married    Spouse name: Not on file  . Number of children: Not on file  . Years of education: Not on file  . Highest education level: Not on file  Occupational History  . Not on file  Social Needs  . Financial resource strain: Not on file  . Food insecurity:    Worry: Not on file    Inability: Not on file  . Transportation needs:    Medical: Not on file    Non-medical: Not on file  Tobacco Use  . Smoking status: Never Smoker  . Smokeless tobacco: Never Used  Substance and Sexual Activity  . Alcohol use: No    Alcohol/week: 0.0 standard drinks  . Drug use: No  . Sexual activity: Yes    Partners: Male    Birth control/protection: Implant  Lifestyle  . Physical activity:    Days per week: Not on file    Minutes per session: Not on file  . Stress: Not on file  Relationships  . Social connections:    Talks on phone: Not on file    Gets together: Not on file    Attends  religious service: Not on file    Active member of club or organization: Not on file    Attends meetings of clubs or organizations: Not on file    Relationship status: Not on file  . Intimate partner violence:    Fear of current or ex partner: Not on file    Emotionally abused: Not on file    Physically abused: Not on file    Forced sexual activity: Not on file  Other Topics Concern  . Not on file  Social History Narrative  . Not on file    REVIEW OF SYSTEMS: Constitutional: No fevers, chills, or sweats, no generalized fatigue, change in appetite Eyes: No visual changes, double vision, eye pain Ear, nose and throat: No hearing loss, ear pain, nasal congestion, sore throat Cardiovascular: No chest pain, palpitations Respiratory:  No shortness of breath at rest or with exertion, wheezes GastrointestinaI: No nausea, vomiting, diarrhea, abdominal pain, fecal incontinence Genitourinary:  No dysuria, urinary retention or frequency Musculoskeletal:  Low back pain Integumentary: No rash, pruritus,  skin lesions Neurological: as above Psychiatric: No depression, insomnia, anxiety Endocrine: No palpitations, fatigue, diaphoresis, mood swings, change in appetite, change in weight, increased thirst Hematologic/Lymphatic:  No purpura, petechiae. Allergic/Immunologic: no itchy/runny eyes, nasal congestion, recent allergic reactions, rashes  PHYSICAL EXAM: Blood pressure 118/82, pulse 88, temperature 98.9 F (37.2 C), height 4' 11.75" (1.518 m), weight 173 lb (78.5 kg), SpO2 98 %. General: No acute distress.  Patient appears well-groomed.   Head:  Normocephalic/atraumatic Eyes:  Fundi examined but not visualized Neck: supple, no paraspinal tenderness, full range of motion Heart:  Regular rate and rhythm Lungs:  Clear to auscultation bilaterally Back: No paraspinal tenderness Neurological Exam: alert and oriented to person, place, and time. Attention span and concentration intact, recent and  remote memory intact, fund of knowledge intact.  Speech fluent and not dysarthric, language intact.  CN II-XII intact. Bulk and tone normal, muscle strength 5/5 throughout.  Sensation to pinprick intact.  Sensation to vibration slightly reduced in right foot, otherwise intact.  Deep tendon reflexes 2+ throughout, toes downgoing.  Finger to nose and heel to shin testing intact.  Gait slight antalgic, Romberg negative.  IMPRESSION: 1.  Prior history of MS doubted.  Recurrent episodes of left-sided weakness with headache suspected to be hemiplegic migraines. 2.  Chronic migraine without aura, without status migrainosus, not intractable. 3. Low back pain with bilateral lumbosacral radiculopathy (right worse than left)  PLAN: 1.  Repeat MRI of brain with and without contrast.  Further recommendations pending results. 2.  Start nortriptyline 10mg  at bedtime (to address headache and neuralgia).  We can increase to 25mg  at bedtime in 4 weeks if needed. 3.  Limit use of pain relievers to no more than 2 days out of week to prevent risk of rebound or medication-overuse headache. 4.  Check NCV-EMG of lower extremities 5.  Follow up in 4 months.  25 minutes spent face to face with patient, over 50% spent discussing diagnosis and management.  Shon Millet, DO  CC: Maryelizabeth Rowan, MD

## 2018-10-14 ENCOUNTER — Other Ambulatory Visit: Payer: Self-pay

## 2018-10-14 ENCOUNTER — Encounter: Payer: Self-pay | Admitting: Neurology

## 2018-10-14 ENCOUNTER — Ambulatory Visit (INDEPENDENT_AMBULATORY_CARE_PROVIDER_SITE_OTHER): Payer: Commercial Managed Care - PPO | Admitting: Neurology

## 2018-10-14 VITALS — BP 118/82 | HR 88 | Temp 98.9°F | Ht 59.75 in | Wt 173.0 lb

## 2018-10-14 DIAGNOSIS — M5442 Lumbago with sciatica, left side: Secondary | ICD-10-CM | POA: Diagnosis not present

## 2018-10-14 DIAGNOSIS — R9089 Other abnormal findings on diagnostic imaging of central nervous system: Secondary | ICD-10-CM | POA: Diagnosis not present

## 2018-10-14 DIAGNOSIS — G8929 Other chronic pain: Secondary | ICD-10-CM

## 2018-10-14 DIAGNOSIS — G4700 Insomnia, unspecified: Secondary | ICD-10-CM | POA: Diagnosis not present

## 2018-10-14 DIAGNOSIS — M5441 Lumbago with sciatica, right side: Secondary | ICD-10-CM

## 2018-10-14 DIAGNOSIS — G43709 Chronic migraine without aura, not intractable, without status migrainosus: Secondary | ICD-10-CM | POA: Diagnosis not present

## 2018-10-14 DIAGNOSIS — G43409 Hemiplegic migraine, not intractable, without status migrainosus: Secondary | ICD-10-CM

## 2018-10-14 MED ORDER — NORTRIPTYLINE HCL 10 MG PO CAPS: 10.0000 mg | ORAL_CAPSULE | Freq: Every day | ORAL | 3 refills | Status: DC

## 2018-10-14 NOTE — Patient Instructions (Addendum)
1.  We will repeat MRI of brain with and without contrast.  Will contact you with results. 2.  Start nortriptyline 10mg  at bedtime.  If headaches not improved in 4 weeks, contact me and we can increase dose.  This medication will help with leg pain as well 3.  Limit use of pain relievers (Advil, ibuprofen, Aleve, Tylenol, Excedrin) to no more than 2 days out of week to prevent risk of rebound or medication-overuse headache. 4.  Check nerve conduction study of bilateral legs. 5.  Follow up in 4 months.  We have sent a referral to [facility] for your MRI and they will call you directly to schedule your appt. If you need to call them, contact their scheduling department at [phone]  1. Repetiremos la resonancia magntica del cerebro con y sin contraste. Lo contactaremos con los [place]. 2. Comience 10 mg de nortriptilina a la hora de acostarse. Si los dolores de cabeza no mejoran en 4 semanas, contcteme y podemos aumentar la dosis. Este medicamento tambin ayudar con Chief Technology Officer en las piernas. 3. Limite el uso de analgsicos (Advil, ibuprofeno, Aleve, Tylenol, Excedrin) a no ms de 2 das a la semana para Multimedia programmer riesgo de rebote o dolor de cabeza por uso excesivo de medicamentos. 4. Comprobar el estudio de conduccin nerviosa de piernas bilaterales. 5. Seguimiento en 4 meses.  Hemos enviado una referencia al [facility] para su resonancia magntica y lo llamarn directamente para programar su cita. Si necesita llamarlos, comunquese con su departamento de programacin al [phone]

## 2018-10-16 DIAGNOSIS — J069 Acute upper respiratory infection, unspecified: Secondary | ICD-10-CM | POA: Diagnosis not present

## 2018-10-16 DIAGNOSIS — Z6830 Body mass index (BMI) 30.0-30.9, adult: Secondary | ICD-10-CM | POA: Diagnosis not present

## 2018-10-16 DIAGNOSIS — J029 Acute pharyngitis, unspecified: Secondary | ICD-10-CM | POA: Diagnosis not present

## 2018-11-08 IMAGING — CR DG KNEE 1-2V*L*
2 series · 2 of 2 positions shown · non-contrast
Comparison: None.

CLINICAL DATA: Bilateral knee pain left greater than right, no
known injury, initial encounter

EXAM:
LEFT KNEE - 2 VIEW

[t knee ap left]
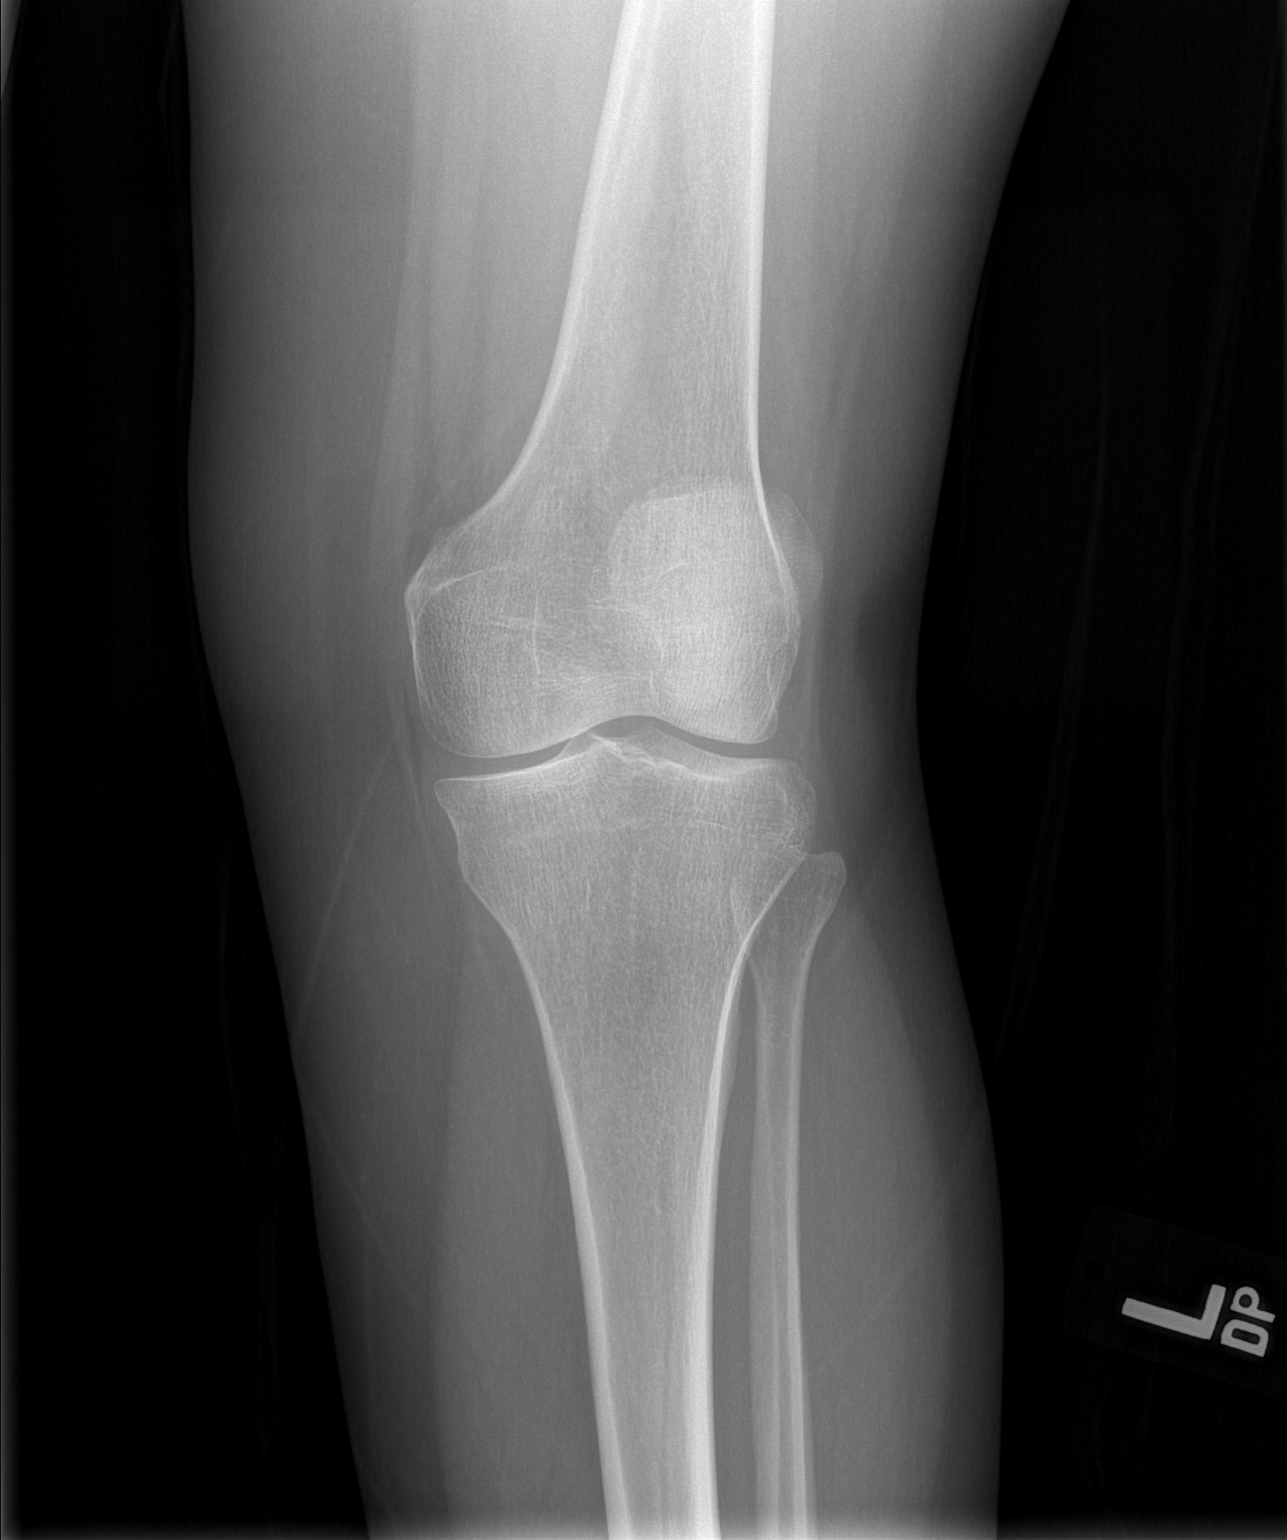

[t knee lat left]
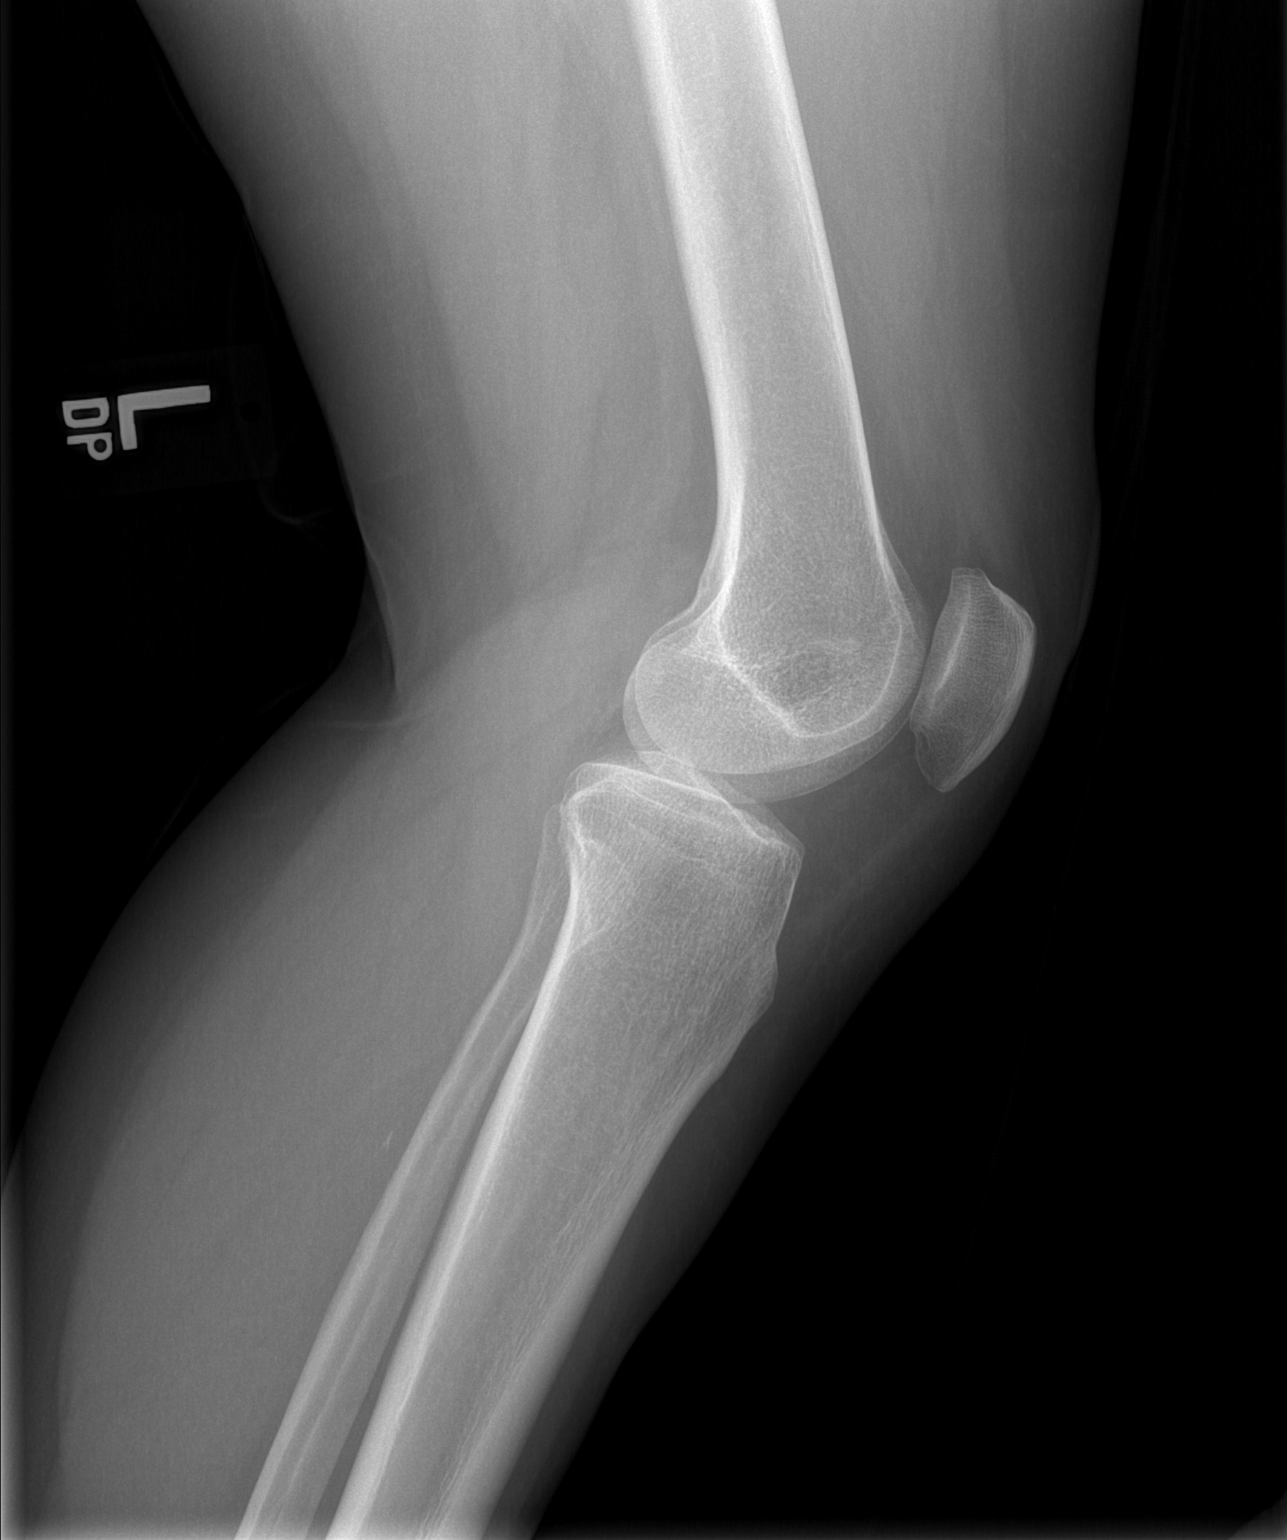

[2 of 2 positions shown; findings below may reference images not displayed]

FINDINGS: No evidence of fracture, dislocation, or joint effusion. No evidence
of arthropathy or other focal bone abnormality. Soft tissues are
unremarkable.
IMPRESSION: No acute abnormality noted.

## 2018-11-10 ENCOUNTER — Encounter: Payer: Commercial Managed Care - PPO | Admitting: Neurology

## 2019-01-25 ENCOUNTER — Ambulatory Visit: Payer: BLUE CROSS/BLUE SHIELD | Admitting: Neurology

## 2019-01-26 ENCOUNTER — Ambulatory Visit: Payer: BLUE CROSS/BLUE SHIELD | Admitting: Neurology

## 2019-02-16 ENCOUNTER — Ambulatory Visit: Payer: Commercial Managed Care - PPO | Admitting: Neurology

## 2019-07-29 ENCOUNTER — Ambulatory Visit: Payer: HRSA Program | Attending: Internal Medicine

## 2019-07-29 ENCOUNTER — Other Ambulatory Visit: Payer: Self-pay

## 2019-07-29 DIAGNOSIS — Z20822 Contact with and (suspected) exposure to covid-19: Secondary | ICD-10-CM

## 2019-07-29 DIAGNOSIS — Z20828 Contact with and (suspected) exposure to other viral communicable diseases: Secondary | ICD-10-CM | POA: Diagnosis present

## 2019-07-30 LAB — NOVEL CORONAVIRUS, NAA: SARS-CoV-2, NAA: NOT DETECTED

## 2019-07-31 ENCOUNTER — Telehealth: Payer: Self-pay | Admitting: Family Medicine

## 2019-07-31 NOTE — Telephone Encounter (Signed)
Patient's daughter is calling to receive the patient's negative COVID test results. Daugher expressed understanding.

## 2020-08-17 ENCOUNTER — Other Ambulatory Visit: Payer: Self-pay

## 2020-08-17 DIAGNOSIS — Z20822 Contact with and (suspected) exposure to covid-19: Secondary | ICD-10-CM

## 2020-08-20 LAB — SARS-COV-2, NAA 2 DAY TAT

## 2020-08-20 LAB — NOVEL CORONAVIRUS, NAA: SARS-CoV-2, NAA: NOT DETECTED

## 2020-08-24 ENCOUNTER — Other Ambulatory Visit: Payer: Self-pay

## 2021-05-30 ENCOUNTER — Encounter: Payer: Self-pay | Admitting: Women's Health

## 2021-06-06 ENCOUNTER — Other Ambulatory Visit: Payer: Self-pay

## 2021-06-06 ENCOUNTER — Ambulatory Visit: Payer: Self-pay | Admitting: Adult Health

## 2021-06-06 ENCOUNTER — Encounter: Payer: Self-pay | Admitting: Adult Health

## 2021-06-06 VITALS — BP 132/79 | HR 85 | Ht 59.5 in | Wt 176.6 lb

## 2021-06-06 DIAGNOSIS — Z3202 Encounter for pregnancy test, result negative: Secondary | ICD-10-CM

## 2021-06-06 DIAGNOSIS — Z3046 Encounter for surveillance of implantable subdermal contraceptive: Secondary | ICD-10-CM | POA: Insufficient documentation

## 2021-06-06 LAB — POCT URINE PREGNANCY: Preg Test, Ur: NEGATIVE

## 2021-06-06 MED ORDER — ETONOGESTREL 68 MG ~~LOC~~ IMPL
68.0000 mg | DRUG_IMPLANT | Freq: Once | SUBCUTANEOUS | Status: AC
  Administered 2021-06-06: 68 mg via SUBCUTANEOUS

## 2021-06-06 NOTE — Progress Notes (Signed)
  Subjective:     Patient ID: [name], female   DOB: 02-Jan-1971, 50 y.o.   MRN: [id number]  HPI [name] is a 50 year old Hispanic female,married, G6P6 in for nexplanon removal and reinsertion. She has interpreter with her. PCP Dr Duanne Guess.  Review of Systems For nexplanon removal and reinsertion Reviewed past medical,surgical, social and family history. Reviewed medications and allergies.     Objective:   Physical Exam BP 132/79 (BP Location: Right Arm, Patient Position: Sitting, Cuff Size: Normal)   Pulse 85   Ht 4' 11.5" (1.511 m)   Wt 176 lb 9.6 oz (80.1 kg)   LMP 05/23/2021 (Exact Date)   BMI 35.07 kg/m  UPT is negative.Consent signed and time out called. Left arm cleansed with betadine, and injected with 1.5 cc 2% lidocaine and waited til numb.Under sterile technique a #11 blade was used to make small vertical incision, and a curved forceps was used to easily remove rod.New rod easily inserted and palpated by provider and pt. Steri strips applied. Pressure dressing applied.    Fall risk is low  Upstream - 06/06/21 1110       Pregnancy Intention Screening   Does the patient want to become pregnant in the next year? No    Does the patient's partner want to become pregnant in the next year? No    Would the patient like to discuss contraceptive options today? No      Contraception Wrap Up   Current Method Hormonal Implant    End Method Hormonal Implant    Contraception Counseling Provided No             Assessment:     1. Encounter for removal and reinsertion of Nexplanon Lot # [id number]   Use condoms x 2 weeks, keep clean and dry x 24 hours, no heavy lifting, keep steri strips on x 72 hours, Keep pressure dressing on x 24 hours. Follow up prn problems.  Plan:     Return in 6 weeks for pap and physical she is self pay Will get her in with [facility] for mammogram

## 2021-06-06 NOTE — Patient Instructions (Signed)
Use condoms x 2 weeks, keep clean and dry x 24 hours, no heavy lifting, keep steri strips on x 72 hours, Keep pressure dressing on x 24 hours. Follow up prn problems.  

## 2021-06-06 NOTE — Addendum Note (Signed)
Addended by: Leilani Able, Quade Ramirez A on: 06/06/2021 12:36 PM   Modules accepted: Orders

## 2021-07-18 ENCOUNTER — Other Ambulatory Visit: Payer: Self-pay | Admitting: Adult Health

## 2022-05-31 ENCOUNTER — Encounter: Payer: Self-pay | Admitting: Adult Health

## 2022-07-12 ENCOUNTER — Encounter: Payer: Self-pay | Admitting: Neurology

## 2022-07-12 NOTE — Progress Notes (Signed)
NEUROLOGY CONSULTATION NOTE  [name] MRN: [id number] DOB: 1970/09/08  Referring provider: Maryelizabeth Rowan, MD Primary care provider: Maryelizabeth Rowan, MD  Reason for consult:  migraines  Assessment/Plan:   Chronic daily headache - likely migraine complicated by medication overuse.   Prior history of MS.  I do not suspect multiple sclerosis.  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 Diffuse extremity pain and weakness of extremities - appears to be reduced effort due to pain rather than actual objective muscle weakness - consider underlying rheumatologic disease Inconsistent subjective numbness Visual obscurations.  Consider idiopathic intracranial hypertension.   To address chronic headaches Start topiramate 25mg  at bedtime for one week, then increase to 50mg  at bedtime Stop Fioricet.   For acute therapy, she will try samples of Ubrelvy 100mg .  Triptans are contraindicated due to history of hemiplegic migraine and cerebrovascular disease Check MRI of brain with and without contrast Check NCV-EMG lower extremities to evaluate for bilateral leg pain and numbness Check labs:  ANA, sed rate, CRP, CK, aldolase, B12, TSH Refer to ophthalmology for blurred vision and evaluation for possible papilledema Follow up in 4 to 5 months.    Subjective:  [name] is a 51 year old right-handed female who presents for migraines.  She is accompanied by her daughter who supplements history.     She was diagnosed with multiple sclerosis in 2009.  At that time, she developed right sided numbness of the face, arm and leg.  There was also associated weakness involving the arm and leg as well.  There was associated bilateral blurred vision.  There was no associated headache.  It lasted 3 or 4 days.  At first, it was believed she had a stroke.  However, MRI of the brain with and without contrast showed numerous non-specific periventricular  and subcortical hyperintensities, greater than expected for age.  There was no abnormal enhancement.  She underwent lumbar puncture in February 2009.  CSF cell count was 4, protein 21, glucose 88, IgG index within normal limits and no bands.  Serum ANA and Sed Rate were negative.  She was treated in the hospital with IV Solu-Medrol.  Initially she was taking Rebif, which worked well.  However, it caused injection site reaction.  Around 2014, she was switched to Tecifedera.  She was taking Tecfedera until about late-2015.  She changed insurance and couldn't afford it.  Between 2009 and March 2016, she had 3 similar episodes, requiring IV Solu-Medrol.  The episodes presented with right sided numbness and tingling of face, arm and leg.  There is some weakness involving the arm and leg.  It was associated with slight posterior headache.  In February 2018, she was hospitalized for acute onset left sided numbness and weakness of face, arm and leg associated with mild headache.  MRI of brain with and without contrast revealed no acute stroke or demyelination.  The cervical and thoracic cord was normal.  MRI of lumbar spine revealed L5 disc degeneration without stenosis.  She was treated with 3 day course of Solu-Medrol for suspected MS flare.  I saw patient in clinic and doubted diagnosis of MS, believing that she was experiencing hemiplegic migraine.  I had her evaluated by  multiple sclerosis specialist Dr. 2010 in 2018 who believed at the time that the likelihood that she had MS was low, although not ruled out, and that the MRI findings were likely related to chronic small vessel ischemic changes of uncertain etiology.  She reports headaches and at night time she has pain in the legs. She has a constant throbbing headache in temples and back of head daily.  No neck pain.  She has associated nausea and and blurred vision. She was taking ibuprofen 800mg  daily which helps relieve the pain. Her PCP told her to  stop taking them about a week ago.  She has chronic diffuse pain of her arms and legs.  She has back pain that radiated down the anterior thighs.  She feels weaker in the legs.  Her PCP prescribed her prednisone for presumed MS flare.  She has blurred vision.  She was told by an eye doctor that she needs glasses but doesn't wear them.  On occasion, she has transient episodes of black out of vision where she cannot see anything.  The longest lasted up to 8 minutes.  No recent eye exam.        She has no family history of MS.  She notes depression.  She was previously on citalopram, which was helpful.   Past NSAIDS/analgesics:  ibuprofen Past abortive triptans:  none Past abortive ergotamine:  none Past muscle relaxants:  none Past anti-emetic:  none Past antidepressant medications:  nortriptyline Past anticonvulsant medications:  gabapentin Past anti-CGRP:  none  Current NSAIDS/analgesics:  Fioricet Current triptans:  none Current ergotamine:  none Current anti-emetic:  none Current muscle relaxants:  none Current Antihypertensive medications:  valsartan Current Antidepressant medications:  none Current Anticonvulsant medications:  none Current anti-CGRP:  none Birth control:  Nexplanon    PAST MEDICAL HISTORY: Past Medical History:  Diagnosis Date   Headache    Multiple sclerosis (HCC) 06/19/2011   Multiple sclerosis (HCC) 06/19/2011   Vision abnormalities     PAST SURGICAL HISTORY: No past surgical history on file.  MEDICATIONS: Current Outpatient Medications on File Prior to Visit  Medication Sig Dispense Refill   etonogestrel (NEXPLANON) 68 MG IMPL implant 1 each by Subdermal route once.     No current facility-administered medications on file prior to visit.    ALLERGIES: No Known Allergies  FAMILY HISTORY: Family History  Problem Relation Age of Onset   Healthy Father    Hypertension Mother     Objective:  Blood pressure 119/69, pulse 93, height 5\' 2"   (1.575 m), weight 178 lb 6.4 oz (80.9 kg), SpO2 97 %. General: No acute distress.  Patient appears well-groomed.   Head:  Normocephalic/atraumatic Eyes:  fundi examined but not visualized Neck: supple, no paraspinal tenderness, full range of motion Back: No paraspinal tenderness Heart: regular rate and rhythm Neurological Exam: Mental status: alert and oriented to person, place, and time, speech fluent and not dysarthric, language intact. Cranial nerves: CN I: not tested CN II: pupils equal, round and reactive to light, visual fields intact CN III, IV, VI:  full range of motion, no nystagmus, no ptosis CN V: facial sensation testing inconsistent  CN VII: upper and lower face symmetric CN VIII: hearing intact CN IX, X: gag intact, uvula midline CN XI: sternocleidomastoid and trapezius muscles intact CN XII: tongue midline Bulk & Tone: normal, no fasciculations. Motor:  muscle strength with give way weakness (at least 4+/5) Sensation:  Pinprick and vibratory sensation testing inconsistent Deep Tendon Reflexes:  2+ throughout,  toes downgoing.   Finger to nose testing:  Without dysmetria.   Heel to shin:  Without dysmetria.   Gait:  antalgic gait.  Romberg negative.    Thank you for allowing me to take part  in the care of this patient.  Shon Millet, DO  CC: Maryelizabeth Rowan, MD

## 2022-07-15 ENCOUNTER — Encounter: Payer: Self-pay | Admitting: Neurology

## 2022-07-15 ENCOUNTER — Other Ambulatory Visit (INDEPENDENT_AMBULATORY_CARE_PROVIDER_SITE_OTHER): Payer: BLUE CROSS/BLUE SHIELD

## 2022-07-15 ENCOUNTER — Ambulatory Visit: Payer: BLUE CROSS/BLUE SHIELD | Admitting: Neurology

## 2022-07-15 VITALS — BP 119/69 | HR 93 | Ht 62.0 in | Wt 178.4 lb

## 2022-07-15 DIAGNOSIS — R52 Pain, unspecified: Secondary | ICD-10-CM

## 2022-07-15 DIAGNOSIS — M545 Low back pain, unspecified: Secondary | ICD-10-CM

## 2022-07-15 DIAGNOSIS — R519 Headache, unspecified: Secondary | ICD-10-CM

## 2022-07-15 DIAGNOSIS — G43709 Chronic migraine without aura, not intractable, without status migrainosus: Secondary | ICD-10-CM | POA: Diagnosis not present

## 2022-07-15 DIAGNOSIS — H543 Unqualified visual loss, both eyes: Secondary | ICD-10-CM | POA: Diagnosis not present

## 2022-07-15 DIAGNOSIS — M5459 Other low back pain: Secondary | ICD-10-CM

## 2022-07-15 DIAGNOSIS — G8929 Other chronic pain: Secondary | ICD-10-CM

## 2022-07-15 MED ORDER — TOPIRAMATE 50 MG PO TABS: ORAL_TABLET | ORAL | 0 refills | Status: DC

## 2022-07-15 NOTE — Patient Instructions (Addendum)
Start topiramate 1/2 tablet at bedtime for one week, then increase to 1 tablet at bedtime.  If no improvement by end of bottle, let me know and I will increase dose. Stop butalbital-caffeine pill When headache gets severe, take ubrelvy.  May repeat after 2 hours.  Maximum 2 tablets in 24 hours.  Let me know if effective or not MRI of brain with and without contrast Nerve conduction study of lower extremities Check labs ANA, sed rate, CRP, CK, aldolase, B12, TSH Refer to ophthalmology Follow up in 4-5 months.     1. Comience con 1/2 tableta de topiramato antes de acostarse durante una semana, luego aumente a 1 tableta antes de acostarse. Si no mejora al final del frasco, hgamelo saber y aumentar la dosis. 2. Deje de tomar la pastilla de butalbital y cafena 3. Cuando el dolor de cabeza se vuelva intenso, tome [place]. Puede repetirse despus de 2 horas. Mximo 2 comprimidos en 24 horas. Djame saber si es efectivo o no. 4. Resonancia magntica del cerebro con y sin contraste. 5. Estudio de conduccin nerviosa de las extremidades inferiores. 6. Verifique los laboratorios ANA, tasa de sedimentacin, PCR, CK, aldolasa, B12, TSH 7. Consultar con oftalmologa. 8. Seguimiento en 4-5 meses.

## 2022-07-16 LAB — ANA+ENA+DNA/DS+SCL 70+SJOSSA/B
ENA RNP Ab: <0.2 AI (ref 0.0–0.9)
ENA SSA (RO) Ab: <0.2 AI (ref 0.0–0.9)
Scleroderma (Scl-70) (ENA) Antibody, IgG: 1.9 AI — ABNORMAL HIGH (ref 0.0–0.9)

## 2022-07-16 LAB — TSH: TSH: 1.06 u[IU]/mL (ref 0.35–5.50)

## 2022-07-16 LAB — SEDIMENTATION RATE: Sed Rate: 63 mm/hr — ABNORMAL HIGH (ref 0–30)

## 2022-07-16 LAB — C-REACTIVE PROTEIN: CRP: 1.6 mg/dL (ref 0.5–20.0)

## 2022-07-16 LAB — VITAMIN B12: Vitamin B-12: 261 pg/mL (ref 211–911)

## 2022-07-17 LAB — ANA+ENA+DNA/DS+SCL 70+SJOSSA/B
ENA SM Ab Ser-aCnc: <0.2 AI (ref 0.0–0.9)
ENA SSB (LA) Ab: <0.2 AI (ref 0.0–0.9)
dsDNA Ab: 2 IU/mL (ref 0–9)

## 2022-07-17 LAB — ALDOLASE: Aldolase: 5 U/L (ref <=8.1)

## 2022-07-18 ENCOUNTER — Ambulatory Visit: Payer: BLUE CROSS/BLUE SHIELD | Admitting: Neurology

## 2022-07-18 DIAGNOSIS — H543 Unqualified visual loss, both eyes: Secondary | ICD-10-CM

## 2022-07-18 DIAGNOSIS — M79604 Pain in right leg: Secondary | ICD-10-CM

## 2022-07-18 NOTE — Procedures (Signed)
[facility]  [address] [place], [address]  [place], [place] [address] Tel: [phone] Fax: [phone] Test Date:  07/18/2022  Patient: [name] DOB: 1970/11/01 Physician: Nita Sickle, DO  Sex: Female Height: 5\' 2"  Ref Phys: , DO  ID#: Shon Millet   Technician:    History: This is a 51 year old female referred for evaluation of bilateral leg pain and paresthesias.  NCV & EMG Findings: Electrodiagnostic testing of the right lower extremity and additional studies of the left shows: Bilateral sural and superficial peroneal sensory responses are within normal limits. Bilateral peroneal and tibial motor responses are within normal limits. Bilateral tibial H reflex studies are within normal limits. There is no evidence of active or chronic motor axonal changes affecting any of the tested muscles.  Motor unit configuration and recruitment pattern is within normal limits.   Impression: This is a normal study of the lower extremities.  In particular, there is no evidence of a large fiber sensorimotor polyneuropathy or lumbosacral radiculopathy.    ___________________________ 44, DO    Nerve Conduction Studies   Stim Site NR Peak (ms) Norm Peak (ms) O-P Amp (V) Norm O-P Amp  Left Sup Peroneal Anti Sensory (Ant Lat Mall)  33 C  12 cm    2.2 <4.6 50.9 >4  Right Sup Peroneal Anti Sensory (Ant Lat Mall)  33 C  12 cm    2.2 <4.6 44.9 >4  Left Sural Anti Sensory (Lat Mall)  33 C  Calf    2.5 <4.6 55.1 >4  Right Sural Anti Sensory (Lat Mall)  33 C  Calf    2.5 <4.6 49.9 >4     Stim Site NR Onset (ms) Norm Onset (ms) O-P Amp (mV) Norm O-P Amp Site1 Site2 Delta-0 (ms) Dist (cm) Vel (m/s) Norm Vel (m/s)  Left Peroneal Motor (Ext Dig Brev)  33 C  Ankle    3.6 <6.0 6.4 >2.5 B Fib Ankle 6.6 32.0 48 >40  B Fib    10.2  6.4  Poplt B Fib 1.1 6.0 55 >40  Poplt    11.3  6.3         Right Peroneal Motor (Ext Dig Brev)  33 C  Ankle    2.9  <6.0 6.6 >2.5 B Fib Ankle 6.2 33.0 53 >40  B Fib    9.1  6.6  Poplt B Fib 1.4 8.0 57 >40  Poplt    10.5  6.4         Left Tibial Motor (Abd Hall Brev)  33 C  Ankle    3.3 <6.0 6.9 >4 Knee Ankle 7.2 35.0 49 >40  Knee    10.5  6.2         Right Tibial Motor (Abd Hall Brev)  33 C  Ankle    4.5 <6.0 9.2 >4 Knee Ankle 6.1 35.0 57 >40  Knee    10.6  6.7          Electromyography   Side Muscle Ins.Act Fibs Fasc Recrt Amp Dur Poly Activation Comment  Right AntTibialis Nml Nml Nml Nml Nml Nml Nml Nml N/A  Right Gastroc Nml Nml Nml Nml Nml Nml Nml Nml N/A  Right Flex Dig Long Nml Nml Nml Nml Nml Nml Nml Nml N/A  Right RectFemoris Nml Nml Nml Nml Nml Nml Nml Nml N/A  Right GluteusMed Nml Nml Nml Nml Nml Nml Nml Nml N/A  Left AntTibialis Nml Nml Nml Nml Nml Nml Nml  Nml N/A  Left Gastroc Nml Nml Nml Nml Nml Nml Nml Nml N/A  Left Flex Dig Long Nml Nml Nml Nml Nml Nml Nml Nml N/A  Left RectFemoris Nml Nml Nml Nml Nml Nml Nml Nml N/A  Left GluteusMed Nml Nml Nml Nml Nml Nml Nml Nml N/A      Waveforms:

## 2022-07-19 NOTE — Progress Notes (Signed)
Tried calling patient, no answer. LMOVm to call the office back.

## 2022-07-24 ENCOUNTER — Telehealth: Payer: Self-pay | Admitting: Neurology

## 2022-07-24 MED ORDER — TOPIRAMATE 50 MG PO TABS: ORAL_TABLET | ORAL | 0 refills | Status: DC

## 2022-07-24 NOTE — Telephone Encounter (Signed)
See results note,  Per daughter, Her mother was advised Topiramate wasn't sent into the pharmacy.  Advised the patient daughter scripts sent on Visit date.   Will resend script now.

## 2022-07-24 NOTE — Telephone Encounter (Signed)
Pt's daughter called in and left a message with the access nurse. She is returning a call

## 2022-07-24 NOTE — Progress Notes (Signed)
Patient daughter advised of mother's EMG and lab results.

## 2022-08-03 ENCOUNTER — Ambulatory Visit
Admission: RE | Admit: 2022-08-03 | Discharge: 2022-08-03 | Disposition: A | Discharge status: Home / Self Care | Payer: BLUE CROSS/BLUE SHIELD | Source: Ambulatory Visit | Attending: Neurology | Admitting: Neurology

## 2022-08-03 DIAGNOSIS — H543 Unqualified visual loss, both eyes: Secondary | ICD-10-CM

## 2022-08-03 DIAGNOSIS — G43709 Chronic migraine without aura, not intractable, without status migrainosus: Secondary | ICD-10-CM

## 2022-08-03 MED ORDER — GADOPICLENOL 0.5 MMOL/ML IV SOLN
8.0000 mL | Freq: Once | INTRAVENOUS | Status: AC | PRN
  Administered 2022-08-03: 8 mL via INTRAVENOUS

## 2022-08-16 NOTE — Progress Notes (Signed)
Patient daughter advised of MRI results.

## 2022-09-17 ENCOUNTER — Other Ambulatory Visit: Payer: Self-pay | Admitting: Neurology

## 2022-09-20 ENCOUNTER — Other Ambulatory Visit: Payer: Self-pay | Admitting: Neurology

## 2022-09-21 ENCOUNTER — Other Ambulatory Visit: Payer: Self-pay | Admitting: Neurology

## 2022-09-26 ENCOUNTER — Ambulatory Visit (INDEPENDENT_AMBULATORY_CARE_PROVIDER_SITE_OTHER): Payer: BLUE CROSS/BLUE SHIELD

## 2022-09-26 ENCOUNTER — Ambulatory Visit
Admission: EM | Admit: 2022-09-26 | Discharge: 2022-09-26 | Disposition: A | Discharge status: Home / Self Care | Payer: BLUE CROSS/BLUE SHIELD

## 2022-09-26 DIAGNOSIS — R062 Wheezing: Secondary | ICD-10-CM | POA: Diagnosis not present

## 2022-09-26 DIAGNOSIS — R059 Cough, unspecified: Secondary | ICD-10-CM

## 2022-09-26 DIAGNOSIS — R0602 Shortness of breath: Secondary | ICD-10-CM

## 2022-09-26 DIAGNOSIS — R0600 Dyspnea, unspecified: Secondary | ICD-10-CM | POA: Diagnosis not present

## 2022-09-26 MED ORDER — PREDNISONE 20 MG PO TABS: 40.0000 mg | ORAL_TABLET | Freq: Every day | ORAL | 0 refills | Status: AC

## 2022-09-26 MED ORDER — METHYLPREDNISOLONE SODIUM SUCC 125 MG IJ SOLR
80.0000 mg | Freq: Once | INTRAMUSCULAR | Status: AC
  Administered 2022-09-26: 80 mg via INTRAMUSCULAR

## 2022-09-26 MED ORDER — AZITHROMYCIN 250 MG PO TABS: 250.0000 mg | ORAL_TABLET | Freq: Every day | ORAL | 0 refills | Status: DC

## 2022-09-26 MED ORDER — ALBUTEROL SULFATE (2.5 MG/3ML) 0.083% IN NEBU
2.5000 mg | INHALATION_SOLUTION | Freq: Once | RESPIRATORY_TRACT | Status: AC
  Administered 2022-09-26: 2.5 mg via RESPIRATORY_TRACT

## 2022-09-26 MED ORDER — PROMETHAZINE-DM 6.25-15 MG/5ML PO SYRP: 5.0000 mL | ORAL_SOLUTION | Freq: Four times a day (QID) | ORAL | 0 refills | Status: DC | PRN

## 2022-09-26 MED ORDER — ALBUTEROL SULFATE HFA 108 (90 BASE) MCG/ACT IN AERS: 2.0000 | INHALATION_SPRAY | Freq: Four times a day (QID) | RESPIRATORY_TRACT | 0 refills | Status: DC | PRN

## 2022-09-26 NOTE — ED Provider Notes (Signed)
[facility]    CSN: [id number] Arrival date & time: 09/26/22  [place]      History   Chief Complaint Chief Complaint  Patient presents with   Cough    HPI [name] is a 52 y.o. female.   The history is provided by the patient.   The patient presents for complaints of cough, wheezing, and chest pain with breathing for the past week.  Patient denies fever, chills, headache, ear pain, abdominal pain, nausea, vomiting, or diarrhea.  Patient states that she has had episodes of "feeling hot" at home.  She states that she started taking Delsym, but that has not helped her symptoms.  Patient denies history of smoking or asthma.  Per patient's chart, patient with history of MS.  Past Medical History:  Diagnosis Date   Headache    Multiple sclerosis ([place]) 06/19/2011   Multiple sclerosis ([place]) 06/19/2011   Vision abnormalities     Patient Active Problem List   Diagnosis Date Noted   Encounter for removal and reinsertion of Nexplanon 06/06/2021   Abnormal brain MRI 11/04/2016   Numbness 11/04/2016   Gait disturbance 11/04/2016   Depression 11/04/2016   Insomnia 11/04/2016   Difficulty in walking, not elsewhere classified    Hemiparesis, left ([place])    Atypical chest pain 09/09/2016   Hyperglycemia 09/09/2016   Left-sided weakness    Frequent headaches 09/08/2016   Multiple sclerosis exacerbation ([place]) 09/08/2016   Relapsing remitting multiple sclerosis ([place]) 10/19/2015   Nexplanon insertion 05/15/2015   Multiple sclerosis ([place]) 06/19/2011    History reviewed. No pertinent surgical history.  OB History     Gravida  6   Para  6   Term  6   Preterm  0   AB  0   Living         SAB  0   IAB  0   Ectopic  0   Multiple      Live Births               Home Medications    Prior to Admission medications   Medication Sig Start Date End Date Taking? Authorizing Provider  albuterol (VENTOLIN HFA) 108 (90 Base) MCG/ACT inhaler  Inhale 2 puffs into the lungs every 6 (six) hours as needed for wheezing or shortness of breath. 09/26/22  Yes Joeleen Wortley-[name], Alda Lea, NP  azithromycin (ZITHROMAX) 250 MG tablet Take 1 tablet (250 mg total) by mouth daily. Take first 2 tablets together, then 1 every day until finished. 09/26/22  Yes Lanecia Sliva-[name], Alda Lea, NP  omeprazole (PRILOSEC) 40 MG capsule Take 40 mg by mouth daily. 07/19/22  Yes [provider]  predniSONE (DELTASONE) 20 MG tablet Take 2 tablets (40 mg total) by mouth daily with breakfast for 5 days. 09/26/22 10/01/22 Yes Priscella Donna-[name], Alda Lea, NP  promethazine-dextromethorphan (PROMETHAZINE-DM) 6.25-15 MG/5ML syrup Take 5 mLs by mouth 4 (four) times daily as needed for cough. 09/26/22  Yes Phoenix Riesen-[name], Alda Lea, NP  Butalbital-APAP-Caffeine 50-300-40 MG CAPS Take 50 mg by mouth daily. 07/13/22   [provider]  etonogestrel (NEXPLANON) 68 MG IMPL implant 1 each by Subdermal route once.    [provider]  topiramate (TOPAMAX) 50 MG tablet TAKE 1  (100m) TABLET AT BEDTIME 09/17/22   Jaffe, Adam R, DO  valsartan (DIOVAN) 80 MG tablet Take 80 mg by mouth daily. 07/13/22   [provider]    Family History Family History  Problem Relation Age of  Onset   Healthy Father    Hypertension Mother     Social History Social History   Tobacco Use   Smoking status: Never   Smokeless tobacco: Never  Vaping Use   Vaping Use: Never used  Substance Use Topics   Alcohol use: No    Alcohol/week: 0.0 standard drinks of alcohol   Drug use: No     Allergies   Patient has no known allergies.   Review of Systems Review of Systems Per HPI  Physical Exam Triage Vital Signs ED Triage Vitals  Enc Vitals Group     BP 09/26/22 1834 (!) 162/82     Pulse Rate 09/26/22 1834 (!) 108     Resp 09/26/22 1834 (!) 24     Temp --      Temp Source 09/26/22 1834 Oral     SpO2 09/26/22 1834 96 %     Weight --      Height --      Head  Circumference --      Peak Flow --      Pain Score 09/26/22 1836 7     Pain Loc --      Pain Edu? --      Excl. in [place]? --    No data found.  Updated Vital Signs BP (!) 162/82 (BP Location: Right Arm)   Pulse (!) 106   Resp (!) 24   SpO2 98%   Visual Acuity Right Eye Distance:   Left Eye Distance:   Bilateral Distance:    Right Eye Near:   Left Eye Near:    Bilateral Near:     Physical Exam Vitals and nursing note reviewed.  Constitutional:      General: She is not in acute distress.    Comments: Appears uncomfortable, grabbing her chest when she coughs.  HENT:     Head: Normocephalic.     Right Ear: Tympanic membrane, ear canal and external ear normal.     Left Ear: Tympanic membrane, ear canal and external ear normal.     Nose: Nose normal.     Mouth/Throat:     Mouth: Mucous membranes are moist.     Pharynx: Posterior oropharyngeal erythema present. No oropharyngeal exudate.  Eyes:     Extraocular Movements: Extraocular movements intact.     Conjunctiva/sclera: Conjunctivae normal.     Pupils: Pupils are equal, round, and reactive to light.  Cardiovascular:     Rate and Rhythm: Normal rate and regular rhythm.     Pulses: Normal pulses.     Heart sounds: Normal heart sounds.  Pulmonary:     Effort: Pulmonary effort is normal.     Breath sounds: Wheezing (Expiratory wheezing noted throughout) present.  Abdominal:     General: Bowel sounds are normal.     Palpations: Abdomen is soft.     Tenderness: There is no abdominal tenderness.  Musculoskeletal:     Cervical back: Normal range of motion.  Lymphadenopathy:     Cervical: No cervical adenopathy.  Skin:    General: Skin is warm and dry.  Neurological:     General: No focal deficit present.     Mental Status: She is alert and oriented to person, place, and time.  Psychiatric:        Mood and Affect: Mood normal.        Behavior: Behavior normal.      UC Treatments / Results  Labs (all labs ordered  are listed, but  only abnormal results are displayed) Labs Reviewed - No data to display  EKG   Radiology DG Chest 2 View  Result Date: 09/26/2022 CLINICAL DATA:  Cough, wheezing, dyspnea EXAM: CHEST - 2 VIEW COMPARISON:  09/23/2007 FINDINGS: Lungs volumes are small, but are symmetric and are clear. No pneumothorax or pleural effusion. Cardiac size within normal limits. Pulmonary vascularity is normal. Osseous structures are age-appropriate. No acute bone abnormality. IMPRESSION: 1. Pulmonary hypoinflation. Electronically Signed   By: Fidela Salisbury M.D.   On: 09/26/2022 18:52    Procedures Procedures (including critical care time)  Medications Ordered in UC Medications  methylPREDNISolone sodium succinate (SOLU-MEDROL) 125 mg/2 mL injection 80 mg (80 mg Intramuscular Given 09/26/22 1848)  albuterol (PROVENTIL) (2.5 MG/3ML) 0.083% nebulizer solution 2.5 mg (2.5 mg Nebulization Given 09/26/22 1849)    Initial Impression / Assessment and Plan / UC Course  I have reviewed the triage vital signs and the nursing notes.  Pertinent labs & imaging results that were available during my care of the patient were reviewed by me and considered in my medical decision making (see chart for details).  The patient presents tachypneic and mildly tachycardic.  She is also hypertensive, but she is in no acute distress.  Chest x-ray shows pulmonary hypoinflation.  On exam, patient has both expiratory wheezing and rhonchi noted.  Rhonchi improved somewhat with cough.  Based on the patient's presentation, will cover patient empirically with azithromycin 250 mg tablets, and albuterol inhaler to help with wheezing and shortness of breath, and prednisone 50 mg for the next 5 days.  Patient was also prescribed Promethazine DM to help with her cough at nighttime.  Supportive care recommendations were provided to the patient to include increasing fluids, allowing for plenty of rest, and use of a humidifier in her bedroom  at nighttime during sleep.  Patient was given strict ER follow-up precautions to include shortness of breath, worsening wheezing, or difficulty breathing.  Patient advised to follow-up with her primary care physician within the next 7 to 10 days for reevaluation.  Patient is in agreement with this plan of care and verbalizes understanding.  All questions were answered.  Patient stable for discharge.   Final Clinical Impressions(s) / UC Diagnoses   Final diagnoses:  Cough, unspecified type  Wheezing  Shortness of breath     Discharge Instructions      The x-ray did not show pneumonia. Take medication as prescribed. Increase fluids and allow for plenty of rest. May take over-the-counter ibuprofen or Tylenol as needed for pain, fever, or general discomfort. Recommend use of a humidifier in your bedroom at nighttime during sleep and sleeping elevated on pillows while cough symptoms persist. If you develop worsening shortness of breath, wheezing, difficulty breathing, or other concerns, please follow-up in the emergency department immediately. Please follow-up with your primary care physician within the next 7 to 10 days for reevaluation if symptoms fail to improve. Follow-up as needed.     ED Prescriptions     Medication Sig Dispense Auth. Provider   predniSONE (DELTASONE) 20 MG tablet Take 2 tablets (40 mg total) by mouth daily with breakfast for 5 days. 10 tablet Aidyn Sportsman-[name], Alda Lea, NP   promethazine-dextromethorphan (PROMETHAZINE-DM) 6.25-15 MG/5ML syrup Take 5 mLs by mouth 4 (four) times daily as needed for cough. 118 mL Burwell Bethel-[name], Alda Lea, NP   azithromycin (ZITHROMAX) 250 MG tablet Take 1 tablet (250 mg total) by mouth daily. Take first 2 tablets together, then 1 every day until finished.  6 tablet Rakeb Kibble-[name], Alda Lea, NP   albuterol (VENTOLIN HFA) 108 (90 Base) MCG/ACT inhaler Inhale 2 puffs into the lungs every 6 (six) hours as needed for wheezing or shortness of  breath. 8 g Arwen Haseley-[name], Alda Lea, NP      PDMP not reviewed this encounter.   Tish Men, NP 09/26/22 [phone]

## 2022-09-26 NOTE — Discharge Instructions (Addendum)
The x-ray did not show pneumonia. Take medication as prescribed. Increase fluids and allow for plenty of rest. May take over-the-counter ibuprofen or Tylenol as needed for pain, fever, or general discomfort. Recommend use of a humidifier in your bedroom at nighttime during sleep and sleeping elevated on pillows while cough symptoms persist. If you develop worsening shortness of breath, wheezing, difficulty breathing, or other concerns, please follow-up in the emergency department immediately. Please follow-up with your primary care physician within the next 7 to 10 days for reevaluation if symptoms fail to improve. Follow-up as needed.

## 2022-09-26 NOTE — ED Triage Notes (Signed)
Pt reports cough, wheezing, sore throat when coughing, rib cage pain x 1 week. OTC cough gives some relief.

## 2022-10-11 LAB — CYTOLOGY - PAP

## 2022-12-03 ENCOUNTER — Ambulatory Visit: Payer: BLUE CROSS/BLUE SHIELD | Admitting: Neurology

## 2022-12-05 ENCOUNTER — Ambulatory Visit: Payer: Self-pay | Admitting: Neurology

## 2023-10-29 ENCOUNTER — Encounter: Payer: Self-pay | Admitting: Emergency Medicine

## 2023-10-29 ENCOUNTER — Ambulatory Visit
Admission: EM | Admit: 2023-10-29 | Discharge: 2023-10-29 | Disposition: A | Discharge status: Home / Self Care | Attending: Family Medicine | Admitting: Family Medicine

## 2023-10-29 DIAGNOSIS — R1010 Upper abdominal pain, unspecified: Secondary | ICD-10-CM

## 2023-10-29 DIAGNOSIS — K5900 Constipation, unspecified: Secondary | ICD-10-CM | POA: Diagnosis not present

## 2023-10-29 DIAGNOSIS — R109 Unspecified abdominal pain: Secondary | ICD-10-CM

## 2023-10-29 DIAGNOSIS — R3129 Other microscopic hematuria: Secondary | ICD-10-CM

## 2023-10-29 HISTORY — DX: Gastro-esophageal reflux disease without esophagitis: K21.9

## 2023-10-29 HISTORY — DX: Pure hypercholesterolemia, unspecified: E78.00

## 2023-10-29 LAB — POCT URINALYSIS DIP (MANUAL ENTRY)
Bilirubin, UA: NEGATIVE
Glucose, UA: NEGATIVE mg/dL
Ketones, POC UA: NEGATIVE mg/dL
Leukocytes, UA: NEGATIVE
Nitrite, UA: NEGATIVE
Protein Ur, POC: NEGATIVE mg/dL
Spec Grav, UA: 1.01 (ref 1.010–1.025)
Urobilinogen, UA: 0.2 U/dL
pH, UA: 5.5 (ref 5.0–8.0)

## 2023-10-29 MED ORDER — TAMSULOSIN HCL 0.4 MG PO CAPS: 0.4000 mg | ORAL_CAPSULE | Freq: Every day | ORAL | 0 refills | Status: DC

## 2023-10-29 MED ORDER — ONDANSETRON 4 MG PO TBDP: 4.0000 mg | ORAL_TABLET | Freq: Three times a day (TID) | ORAL | 0 refills | Status: DC | PRN

## 2023-10-29 MED ORDER — POLYETHYLENE GLYCOL 3350 17 GM/SCOOP PO POWD: 17.0000 g | Freq: Two times a day (BID) | ORAL | 0 refills | Status: AC | PRN

## 2023-10-29 NOTE — ED Provider Notes (Signed)
 [facility]    CSN: [id number] Arrival date & time: 10/29/23  1343      History   Chief Complaint No chief complaint on file.   HPI [name] is a 53 y.o. female.   Medical interpreter declined today, patient wishes for daughter present with her today to translate for them.  Patient presenting today with 2-day history of mid abdominal pain and sometimes left flank pain that occasionally radiates to the back.  Denies fever, chills, vomiting, diarrhea, urinary symptoms.  Does states she has some nausea off and on and has been dealing with constipation.  States she has been passing very small hard amounts of stool once to twice daily for the past week or so.  So far not trying anything over-the-counter for symptoms.  No past history of similar issues.  No known chronic GI issues.    Past Medical History:  Diagnosis Date   Acid reflux    Headache    High cholesterol    Multiple sclerosis (HCC) 06/19/2011   Multiple sclerosis (HCC) 06/19/2011   Vision abnormalities     Patient Active Problem List   Diagnosis Date Noted   Encounter for removal and reinsertion of Nexplanon 06/06/2021   Abnormal brain MRI 11/04/2016   Numbness 11/04/2016   Gait disturbance 11/04/2016   Depression 11/04/2016   Insomnia 11/04/2016   Difficulty in walking, not elsewhere classified    Hemiparesis, left (HCC)    Atypical chest pain 09/09/2016   Hyperglycemia 09/09/2016   Left-sided weakness    Frequent headaches 09/08/2016   Multiple sclerosis exacerbation (HCC) 09/08/2016   Relapsing remitting multiple sclerosis (HCC) 10/19/2015   Nexplanon insertion 05/15/2015   Multiple sclerosis (HCC) 06/19/2011    History reviewed. No pertinent surgical history.  OB History     Gravida  6   Para  6   Term  6   Preterm  0   AB  0   Living         SAB  0   IAB  0   Ectopic  0   Multiple      Live Births               Home Medications    Prior to  Admission medications   Medication Sig Start Date End Date Taking? Authorizing Provider  ondansetron (ZOFRAN-ODT) 4 MG disintegrating tablet Take 1 tablet (4 mg total) by mouth every 8 (eight) hours as needed for nausea or vomiting. 10/29/23  Yes Particia Nearing, PA-C  polyethylene glycol powder (MIRALAX) 17 GM/SCOOP powder Take 17 g by mouth 2 (two) times daily as needed. 10/29/23  Yes Particia Nearing, PA-C  tamsulosin (FLOMAX) 0.4 MG CAPS capsule Take 1 capsule (0.4 mg total) by mouth daily. 10/29/23  Yes Particia Nearing, PA-C  albuterol (VENTOLIN HFA) 108 (90 Base) MCG/ACT inhaler Inhale 2 puffs into the lungs every 6 (six) hours as needed for wheezing or shortness of breath. 09/26/22   Leath-[name], Sadie Haber, NP  Butalbital-APAP-Caffeine 50-300-40 MG CAPS Take 50 mg by mouth daily. 07/13/22   [provider]  etonogestrel (NEXPLANON) 68 MG IMPL implant 1 each by Subdermal route once.    [provider]  omeprazole (PRILOSEC) 40 MG capsule Take 40 mg by mouth daily. 07/19/22   [provider]  topiramate (TOPAMAX) 50 MG tablet TAKE 1  (50mg ) TABLET AT BEDTIME 09/17/22   Jaffe, Adam R, DO  valsartan (DIOVAN) 80 MG tablet Take  80 mg by mouth daily. 07/13/22   [provider]    Family History Family History  Problem Relation Age of Onset   Healthy Father    Hypertension Mother     Social History Social History   Tobacco Use   Smoking status: Never   Smokeless tobacco: Never  Vaping Use   Vaping status: Never Used  Substance Use Topics   Alcohol use: No    Alcohol/week: 0.0 standard drinks of alcohol   Drug use: No     Allergies   Patient has no known allergies.   Review of Systems Review of Systems PER HPI  Physical Exam Triage Vital Signs ED Triage Vitals  Encounter Vitals Group     BP 10/29/23 1423 (!) 143/96     Systolic BP Percentile --      Diastolic BP Percentile --      Pulse Rate 10/29/23 1423 (!) 112      Resp 10/29/23 1423 18     Temp 10/29/23 1423 98.3 F (36.8 C)     Temp Source 10/29/23 1423 Oral     SpO2 10/29/23 1423 98 %     Weight --      Height --      Head Circumference --      Peak Flow --      Pain Score 10/29/23 1424 8     Pain Loc --      Pain Education --      Exclude from Growth Chart --    No data found.  Updated Vital Signs BP (!) 143/96 (BP Location: Right Arm)   Pulse (!) 112   Temp 98.3 F (36.8 C) (Oral)   Resp 18   SpO2 98%   Visual Acuity Right Eye Distance:   Left Eye Distance:   Bilateral Distance:    Right Eye Near:   Left Eye Near:    Bilateral Near:     Physical Exam Vitals and nursing note reviewed.  Constitutional:      Appearance: Normal appearance. She is not ill-appearing.  HENT:     Head: Atraumatic.     Mouth/Throat:     Mouth: Mucous membranes are moist.  Eyes:     Extraocular Movements: Extraocular movements intact.     Conjunctiva/sclera: Conjunctivae normal.  Cardiovascular:     Rate and Rhythm: Normal rate and regular rhythm.     Heart sounds: Normal heart sounds.  Pulmonary:     Effort: Pulmonary effort is normal.     Breath sounds: Normal breath sounds.  Abdominal:     General: Bowel sounds are normal. There is no distension.     Palpations: Abdomen is soft.     Tenderness: There is no abdominal tenderness. There is no right CVA tenderness, left CVA tenderness or guarding.  Musculoskeletal:        General: Normal range of motion.     Cervical back: Normal range of motion and neck supple.  Skin:    General: Skin is warm and dry.  Neurological:     Mental Status: She is alert and oriented to person, place, and time.     Motor: No weakness.     Gait: Gait normal.  Psychiatric:        Mood and Affect: Mood normal.        Thought Content: Thought content normal.        Judgment: Judgment normal.      UC Treatments / Results  Labs (  all labs ordered are listed, but only abnormal results are displayed) Labs  Reviewed  POCT URINALYSIS DIP (MANUAL ENTRY) - Abnormal; Notable for the following components:      Result Value   Blood, UA trace-lysed (*)    All other components within normal limits  CBC  COMPREHENSIVE METABOLIC PANEL  LIPASE    EKG   Radiology No results found.  Procedures Procedures (including critical care time)  Medications Ordered in UC Medications - No data to display  Initial Impression / Assessment and Plan / UC Course  I have reviewed the triage vital signs and the nursing notes.  Pertinent labs & imaging results that were available during my care of the patient were reviewed by me and considered in my medical decision making (see chart for details).     Mildly tachycardic and hypertensive in triage, otherwise vital signs reassuring.  She is well-appearing today and in no acute distress.  No red flag findings on exam.  She does have trace lysed blood on urinalysis today, this in addition to her left flank pain may be representative of a kidney stone but discussed with patient that it is impossible to entirely determine this without abdominal imaging.  She has also been constipated, unclear what role this is playing with her symptoms.  Will obtain labs for further evaluation and safety check while trying MiraLAX, increasing fluids for constipation and Flomax in case kidney stone.  Discussed supportive over-the-counter medications, home care, Zofran as needed for nausea and return precautions reviewed.  Final Clinical Impressions(s) / UC Diagnoses   Final diagnoses:  Pain of upper abdomen  Left flank pain  Constipation, unspecified constipation type  Microscopic hematuria     Discharge Instructions      We have sent out some labs for further evaluation of your symptoms.  I have sent in some MiraLAX to help with your constipation and some nausea medication called ondansetron for you to take as needed.  I have also sent a medicine called tamsulosin to help in case  this is a kidney stone.  Follow-up as soon as possible with your primary care provider and go to the emergency department for severe worsening symptoms at any time.    ED Prescriptions     Medication Sig Dispense Auth. Provider   polyethylene glycol powder (MIRALAX) 17 GM/SCOOP powder Take 17 g by mouth 2 (two) times daily as needed. 255 g Particia Nearing, [place]   tamsulosin (FLOMAX) 0.4 MG CAPS capsule Take 1 capsule (0.4 mg total) by mouth daily. 14 capsule Particia Nearing, PA-C   ondansetron (ZOFRAN-ODT) 4 MG disintegrating tablet Take 1 tablet (4 mg total) by mouth every 8 (eight) hours as needed for nausea or vomiting. 20 tablet Particia Nearing, [place]      PDMP not reviewed this encounter.   Particia Nearing, [place] 10/29/23 1621

## 2023-10-29 NOTE — ED Triage Notes (Signed)
 Abd pain that radiates to back and down legs since Monday.

## 2023-10-29 NOTE — Discharge Instructions (Signed)
 We have sent out some labs for further evaluation of your symptoms.  I have sent in some MiraLAX to help with your constipation and some nausea medication called ondansetron for you to take as needed.  I have also sent a medicine called tamsulosin to help in case this is a kidney stone.  Follow-up as soon as possible with your primary care provider and go to the emergency department for severe worsening symptoms at any time.

## 2023-10-30 LAB — COMPREHENSIVE METABOLIC PANEL WITH GFR
ALT: 23 IU/L (ref 0–32)
AST: 16 IU/L (ref 0–40)
Albumin: 4.6 g/dL (ref 3.8–4.9)
Alkaline Phosphatase: 138 IU/L — ABNORMAL HIGH (ref 44–121)
BUN/Creatinine Ratio: 16 (ref 9–23)
BUN: 12 mg/dL (ref 6–24)
Bilirubin Total: 0.2 mg/dL (ref 0.0–1.2)
CO2: 17 mmol/L — ABNORMAL LOW (ref 20–29)
Calcium: 10.3 mg/dL — ABNORMAL HIGH (ref 8.7–10.2)
Chloride: 105 mmol/L (ref 96–106)
Creatinine, Ser: 0.75 mg/dL (ref 0.57–1.00)
Globulin, Total: 3.2 g/dL (ref 1.5–4.5)
Glucose: 100 mg/dL — ABNORMAL HIGH (ref 70–99)
Potassium: 4.3 mmol/L (ref 3.5–5.2)
Sodium: 140 mmol/L (ref 134–144)
Total Protein: 7.8 g/dL (ref 6.0–8.5)
eGFR: 96 mL/min/{1.73_m2} (ref >59)

## 2023-10-30 LAB — CBC
Hematocrit: 45.5 % (ref 34.0–46.6)
Hemoglobin: 15.2 g/dL (ref 11.1–15.9)
MCH: 29.8 pg (ref 26.6–33.0)
MCHC: 33.4 g/dL (ref 31.5–35.7)
MCV: 89 fL (ref 79–97)
Platelets: 381 10*3/uL (ref 150–450)
RBC: 5.1 x10E6/uL (ref 3.77–5.28)
RDW: 12.4 % (ref 11.7–15.4)
WBC: 7.7 10*3/uL (ref 3.4–10.8)

## 2023-10-30 LAB — LIPASE: Lipase: 53 U/L (ref 14–72)

## 2023-11-10 ENCOUNTER — Other Ambulatory Visit: Payer: Self-pay | Admitting: Family Medicine

## 2023-11-21 ENCOUNTER — Other Ambulatory Visit: Payer: Self-pay | Admitting: Family Medicine

## 2023-12-18 ENCOUNTER — Ambulatory Visit (INDEPENDENT_AMBULATORY_CARE_PROVIDER_SITE_OTHER)

## 2023-12-18 ENCOUNTER — Ambulatory Visit
Admission: EM | Admit: 2023-12-18 | Discharge: 2023-12-18 | Disposition: A | Discharge status: Home / Self Care | Attending: Nurse Practitioner | Admitting: Nurse Practitioner

## 2023-12-18 ENCOUNTER — Encounter: Payer: Self-pay | Admitting: Emergency Medicine

## 2023-12-18 DIAGNOSIS — R109 Unspecified abdominal pain: Secondary | ICD-10-CM | POA: Insufficient documentation

## 2023-12-18 DIAGNOSIS — R1030 Lower abdominal pain, unspecified: Secondary | ICD-10-CM | POA: Diagnosis not present

## 2023-12-18 LAB — POCT URINALYSIS DIP (MANUAL ENTRY)
Bilirubin, UA: NEGATIVE
Glucose, UA: NEGATIVE mg/dL
Ketones, POC UA: NEGATIVE mg/dL
Leukocytes, UA: NEGATIVE
Nitrite, UA: NEGATIVE
Protein Ur, POC: NEGATIVE mg/dL
Spec Grav, UA: 1.02 (ref 1.010–1.025)
Urobilinogen, UA: 0.2 U/dL
pH, UA: 5.5 (ref 5.0–8.0)

## 2023-12-18 MED ORDER — KETOROLAC TROMETHAMINE 30 MG/ML IJ SOLN
30.0000 mg | Freq: Once | INTRAMUSCULAR | Status: AC
  Administered 2023-12-18: 30 mg via INTRAMUSCULAR

## 2023-12-18 NOTE — ED Triage Notes (Signed)
 Lower abd and lower back pain x 3 days.  States sometimes she feels the pain radiating to both legs.

## 2023-12-18 NOTE — ED Provider Notes (Signed)
 [facility]    CSN: [id number] Arrival date & time: 12/18/23  0803      History   Chief Complaint Chief Complaint  Patient presents with   Abdominal Pain   Back Pain    HPI [name] is a 53 y.o. female.   The history is provided by the patient.   Patient presents with a 3-day history of lower abdominal pain and bilateral flank pain.  Patient states symptoms have worsened over the past 24 hours.  Patient states that the pain "comes and goes."  Rates the pain 9/10 at present.  Patient denies pain with urination, hematuria, or urgency, but states that she is going more often at nighttime.  States her last bowel movement was this morning, states that she does have episodes where she does not go every day.  Patient was seen in this clinic at the end of March for the same or similar symptoms.  Reports that she did follow-up with her PCP and they did not feel that she had a kidney stone.  Daughter reports that office was supposed to schedule a CT scan for the patient but they have never heard back from the office, that was approximately 1 month ago.  Patient states that she did take the prescriptions for only 1 to 2 days that were previously prescribed at her last appointment in this clinic.  Patient denies fever, chills, chest pain, nausea, vomiting, or diarrhea.  Past Medical History:  Diagnosis Date   Acid reflux    Headache    High cholesterol    Multiple sclerosis (HCC) 06/19/2011   Multiple sclerosis (HCC) 06/19/2011   Vision abnormalities     Patient Active Problem List   Diagnosis Date Noted   Encounter for removal and reinsertion of Nexplanon  06/06/2021   Abnormal brain MRI 11/04/2016   Numbness 11/04/2016   Gait disturbance 11/04/2016   Depression 11/04/2016   Insomnia 11/04/2016   Difficulty in walking, not elsewhere classified    Hemiparesis, left (HCC)    Atypical chest pain 09/09/2016   Hyperglycemia 09/09/2016   Left-sided weakness     Frequent headaches 09/08/2016   Multiple sclerosis exacerbation (HCC) 09/08/2016   Relapsing remitting multiple sclerosis (HCC) 10/19/2015   Nexplanon  insertion 05/15/2015   Multiple sclerosis (HCC) 06/19/2011    History reviewed. No pertinent surgical history.  OB History     Gravida  6   Para  6   Term  6   Preterm  0   AB  0   Living         SAB  0   IAB  0   Ectopic  0   Multiple      Live Births               Home Medications    Prior to Admission medications   Medication Sig Start Date End Date Taking? Authorizing Provider  albuterol  (VENTOLIN  HFA) 108 (90 Base) MCG/ACT inhaler Inhale 2 puffs into the lungs every 6 (six) hours as needed for wheezing or shortness of breath. 09/26/22   Leath-[name], Belen Bowers, NP  Butalbital-APAP-Caffeine 50-300-40 MG CAPS Take 50 mg by mouth daily. 07/13/22   [provider]  etonogestrel  (NEXPLANON ) 68 MG IMPL implant 1 each by Subdermal route once.    [provider]  omeprazole (PRILOSEC) 40 MG capsule Take 40 mg by mouth daily. 07/19/22   [provider]  polyethylene glycol powder (MIRALAX ) 17 GM/SCOOP powder Take 17  g by mouth 2 (two) times daily as needed. 10/29/23   Corbin Dess, PA-C  topiramate  (TOPAMAX ) 50 MG tablet TAKE 1  (50mg ) TABLET AT BEDTIME 09/17/22   Jaffe, Adam R, DO  valsartan (DIOVAN) 80 MG tablet Take 80 mg by mouth daily. 07/13/22   [provider]    Family History Family History  Problem Relation Age of Onset   Healthy Father    Hypertension Mother     Social History Social History   Tobacco Use   Smoking status: Never   Smokeless tobacco: Never  Vaping Use   Vaping status: Never Used  Substance Use Topics   Alcohol use: No    Alcohol/week: 0.0 standard drinks of alcohol   Drug use: No     Allergies   Patient has no known allergies.   Review of Systems Review of Systems Per HPI  Physical Exam Triage Vital Signs ED Triage  Vitals  Encounter Vitals Group     BP 12/18/23 0826 129/83     Systolic BP Percentile --      Diastolic BP Percentile --      Pulse Rate 12/18/23 0826 98     Resp 12/18/23 0826 18     Temp 12/18/23 0826 98.3 F (36.8 C)     Temp Source 12/18/23 0826 Oral     SpO2 12/18/23 0826 97 %     Weight --      Height --      Head Circumference --      Peak Flow --      Pain Score 12/18/23 0827 9     Pain Loc --      Pain Education --      Exclude from Growth Chart --    No data found.  Updated Vital Signs BP 129/83 (BP Location: Right Arm)   Pulse 98   Temp 98.3 F (36.8 C) (Oral)   Resp 18   SpO2 97%   Visual Acuity Right Eye Distance:   Left Eye Distance:   Bilateral Distance:    Right Eye Near:   Left Eye Near:    Bilateral Near:     Physical Exam Vitals and nursing note reviewed.  Constitutional:      General: She is not in acute distress.    Appearance: She is well-developed.  HENT:     Head: Normocephalic.  Eyes:     Extraocular Movements: Extraocular movements intact.     Pupils: Pupils are equal, round, and reactive to light.  Cardiovascular:     Rate and Rhythm: Regular rhythm.     Pulses: Normal pulses.     Heart sounds: Normal heart sounds.  Pulmonary:     Effort: Pulmonary effort is normal. No respiratory distress.     Breath sounds: Normal breath sounds. No stridor. No wheezing, rhonchi or rales.  Chest:     Chest wall: No tenderness.  Abdominal:     General: Bowel sounds are normal.     Palpations: Abdomen is soft.     Tenderness: There is abdominal tenderness. There is right CVA tenderness and left CVA tenderness.  Musculoskeletal:     Cervical back: Normal range of motion.  Skin:    General: Skin is warm and dry.  Neurological:     General: No focal deficit present.     Mental Status: She is alert and oriented to person, place, and time.  Psychiatric:        Mood and Affect:  Mood normal.        Behavior: Behavior normal.      UC  Treatments / Results  Labs (all labs ordered are listed, but only abnormal results are displayed) Labs Reviewed  POCT URINALYSIS DIP (MANUAL ENTRY) - Abnormal; Notable for the following components:      Result Value   Blood, UA trace-intact (*)    All other components within normal limits    EKG   Radiology DG Abd 1 View Result Date: 12/18/2023 CLINICAL DATA:  Lower abdominal pain. EXAM: ABDOMEN - 1 VIEW COMPARISON:  None Available. FINDINGS: No abnormal bowel dilatation. Mild amount of stool seen throughout the colon. Phleboliths are noted in the pelvis. IMPRESSION: Mild stool burden.  No abnormal bowel dilatation. Electronically Signed   By: Rosalene Colon M.D.   On: 12/18/2023 09:12    Procedures Procedures (including critical care time)  Medications Ordered in UC Medications  ketorolac  (TORADOL ) 30 MG/ML injection 30 mg (30 mg Intramuscular Given 12/18/23 0858)    Initial Impression / Assessment and Plan / UC Course  I have reviewed the triage vital signs and the nursing notes.  Pertinent labs & imaging results that were available during my care of the patient were reviewed by me and considered in my medical decision making (see chart for details).  Patient presents with continued abdominal pain.  Patient was seen in this clinic in March and diagnosed with a kidney stone.  Symptoms continue to remain consistent with same.  X-ray of the abdomen consistent with continued constipation.  Do suspect still that patient has a kidney stone given her current symptoms.  Urinalysis is positive for trace blood, no other abnormality seen.  Will send urinalysis for culture.  In the interim, we will have patient continue tamsulosin  previously prescribed along with MiraLAX  previously prescribed, will have patient follow-up with her primary care physician regarding the CT scan that was discussed.  Patient was given Toradol  30 mg IM for pain today.  Will have patient continue over-the-counter  analgesics such as Tylenol or ibuprofen.  Supportive care recommendations were provided and discussed with the patient to include increasing fluids, rest, and dietary changes to prevent constipation.  Patient was advised to follow-up in the emergency department if symptoms worsen.  Patient was in agreement with this plan of care and verbalizes understanding.  All questions were answered.  Patient stable for discharge.   Final Clinical Impressions(s) / UC Diagnoses   Final diagnoses:  Lower abdominal pain   Discharge Instructions   None    ED Prescriptions   None    PDMP not reviewed this encounter.   Hardy Lia, NP 12/18/23 [phone]

## 2023-12-18 NOTE — Discharge Instructions (Addendum)
 You were given an injection of Toradol  30 mg today.  Do not take any additional NSAIDs such as ibuprofen, Aleve, naproxen, or Advil.  You may begin taking ibuprofen on 12/19/2023 as needed for pain or discomfort. The x-ray of your abdomen shows that you are constipated. Begin taking the medications previously prescribed.  You will need to take the medications daily. Make sure you are drinking at least 8-10 8 ounce glasses of water daily. May take over-the-counter Tylenol or ibuprofen as needed for pain, fever, or general discomfort. Please follow-up with your primary care physician regarding the CT scan.  Please call today to see if you can get the appointment scheduled.  Also recommend following up with your primary care physician for continued or ongoing symptoms. If symptoms worsen to include worsening pain, fever, chills, or other concerns, please follow-up in the emergency department for further evaluation. Follow-up as needed.

## 2023-12-19 ENCOUNTER — Ambulatory Visit (HOSPITAL_COMMUNITY): Payer: Self-pay

## 2023-12-19 LAB — URINE CULTURE: Culture: <10000 — AB

## 2024-06-07 ENCOUNTER — Encounter: Admitting: Adult Health

## 2024-07-16 ENCOUNTER — Other Ambulatory Visit: Payer: Self-pay | Admitting: Family Medicine

## 2024-07-16 DIAGNOSIS — Z1231 Encounter for screening mammogram for malignant neoplasm of breast: Secondary | ICD-10-CM

## 2024-08-08 ENCOUNTER — Ambulatory Visit
Admission: EM | Admit: 2024-08-08 | Discharge: 2024-08-08 | Disposition: A | Discharge status: Home / Self Care | Attending: Nurse Practitioner | Admitting: Nurse Practitioner

## 2024-08-08 DIAGNOSIS — J101 Influenza due to other identified influenza virus with other respiratory manifestations: Secondary | ICD-10-CM | POA: Diagnosis not present

## 2024-08-08 LAB — POC SOFIA SARS ANTIGEN FIA: SARS Coronavirus 2 Ag: NEGATIVE

## 2024-08-08 LAB — POCT INFLUENZA A/B
Influenza A, POC: NEGATIVE
Influenza B, POC: POSITIVE — AB

## 2024-08-08 MED ORDER — PROMETHAZINE-DM 6.25-15 MG/5ML PO SYRP: 5.0000 mL | ORAL_SOLUTION | Freq: Four times a day (QID) | ORAL | 0 refills | Status: AC | PRN

## 2024-08-08 MED ORDER — FLUTICASONE PROPIONATE 50 MCG/ACT NA SUSP: 2.0000 | Freq: Every day | NASAL | 0 refills | Status: DC

## 2024-08-08 NOTE — ED Provider Notes (Signed)
 " [facility]    CSN: [id number] Arrival date & time: 08/08/24  1507      History   Chief Complaint Chief Complaint  Patient presents with   Cough    HPI [name] is a 54 y.o. female.   The history is provided by the patient. No language interpreter was used (Patient declines interpretation services, daughter is present to interpret.).   Patient presents for complaints of cough, nasal congestion, runny nose, headache, and chest tightness for the past 3 days.  Denies fever, chills, ear pain, ear drainage, wheezing, difficulty breathing, abdominal pain, nausea, vomiting, diarrhea, or rash.  Patient's daughter reports that the patient's son was sick with the flu earlier this week.  So far, patient has been taking an over-the-counter medication for the cough with minimal relief.  Past Medical History:  Diagnosis Date   Acid reflux    Headache    High cholesterol    Multiple sclerosis 06/19/2011   Multiple sclerosis 06/19/2011   Vision abnormalities     Patient Active Problem List   Diagnosis Date Noted   Encounter for removal and reinsertion of Nexplanon  06/06/2021   Abnormal brain MRI 11/04/2016   Numbness 11/04/2016   Gait disturbance 11/04/2016   Depression 11/04/2016   Insomnia 11/04/2016   Difficulty in walking, not elsewhere classified    Hemiparesis, left (HCC)    Atypical chest pain 09/09/2016   Hyperglycemia 09/09/2016   Left-sided weakness    Frequent headaches 09/08/2016   Multiple sclerosis exacerbation 09/08/2016   Relapsing remitting multiple sclerosis 10/19/2015   Nexplanon  insertion 05/15/2015   Multiple sclerosis (HCC) 06/19/2011    History reviewed. No pertinent surgical history.  OB History     Gravida  6   Para  6   Term  6   Preterm  0   AB  0   Living         SAB  0   IAB  0   Ectopic  0   Multiple      Live Births               Home Medications    Prior to Admission medications   Medication Sig Start Date End Date Taking? Authorizing Provider  fluticasone  (FLONASE ) 50 MCG/ACT nasal spray Place 2 sprays into both nostrils daily. 08/08/24  Yes Leath-[name], Etta PARAS, NP  promethazine -dextromethorphan (PROMETHAZINE -DM) 6.25-15 MG/5ML syrup Take 5 mLs by mouth 4 (four) times daily as needed. 08/08/24  Yes Leath-[name], Etta PARAS, NP  albuterol  (VENTOLIN  HFA) 108 (90 Base) MCG/ACT inhaler Inhale 2 puffs into the lungs every 6 (six) hours as needed for wheezing or shortness of breath. 09/26/22   Leath-[name], Etta PARAS, NP  Butalbital-APAP-Caffeine 50-300-40 MG CAPS Take 50 mg by mouth daily. 07/13/22   [provider]  etonogestrel  (NEXPLANON ) 68 MG IMPL implant 1 each by Subdermal route once.    [provider]  omeprazole (PRILOSEC) 40 MG capsule Take 40 mg by mouth daily. 07/19/22   [provider]  polyethylene glycol powder (MIRALAX ) 17 GM/SCOOP powder Take 17 g by mouth 2 (two) times daily as needed. 10/29/23   Stuart Vernell Norris, PA-C  topiramate  (TOPAMAX ) 50 MG tablet TAKE 1  (50mg ) TABLET AT BEDTIME 09/17/22   Jaffe, Adam R, DO  valsartan (DIOVAN) 80 MG tablet Take 80 mg by mouth daily. 07/13/22   [provider]    Family History Family History  Problem Relation Age of Onset  Healthy Father    Hypertension Mother     Social History Social History[1]   Allergies   Patient has no known allergies.   Review of Systems Review of Systems Per HPI  Physical Exam Triage Vital Signs ED Triage Vitals  Encounter Vitals Group     BP 08/08/24 1528 128/88     Girls Systolic BP Percentile --      Girls Diastolic BP Percentile --      Boys Systolic BP Percentile --      Boys Diastolic BP Percentile --      Pulse Rate 08/08/24 1528 91     Resp 08/08/24 1528 16     Temp 08/08/24 1528 98.5 F (36.9 C)     Temp Source 08/08/24 1528 Oral     SpO2 08/08/24 1528 95 %     Weight --      Height --      Head Circumference --       Peak Flow --      Pain Score 08/08/24 1527 0     Pain Loc --      Pain Education --      Exclude from Growth Chart --    No data found.  Updated Vital Signs BP 128/88 (BP Location: Right Arm)   Pulse 91   Temp 98.5 F (36.9 C) (Oral)   Resp 16   SpO2 95%   Visual Acuity Right Eye Distance:   Left Eye Distance:   Bilateral Distance:    Right Eye Near:   Left Eye Near:    Bilateral Near:     Physical Exam Vitals and nursing note reviewed.  Constitutional:      General: She is not in acute distress.    Appearance: Normal appearance. She is well-developed.  HENT:     Head: Normocephalic and atraumatic.     Right Ear: Tympanic membrane, ear canal and external ear normal.     Left Ear: Tympanic membrane, ear canal and external ear normal.     Nose: Congestion present.     Right Turbinates: Enlarged and swollen.     Left Turbinates: Enlarged and swollen.     Right Sinus: No maxillary sinus tenderness or frontal sinus tenderness.     Left Sinus: No maxillary sinus tenderness or frontal sinus tenderness.     Mouth/Throat:     Lips: Pink.     Mouth: Mucous membranes are moist.     Pharynx: Uvula midline. Postnasal drip present. No pharyngeal swelling, oropharyngeal exudate, posterior oropharyngeal erythema or uvula swelling.     Comments: Cobblestoning present to posterior oropharynx  Eyes:     Extraocular Movements: Extraocular movements intact.     Conjunctiva/sclera: Conjunctivae normal.     Pupils: Pupils are equal, round, and reactive to light.  Neck:     Thyroid: No thyromegaly.     Trachea: No tracheal deviation.  Cardiovascular:     Rate and Rhythm: Normal rate and regular rhythm.     Pulses: Normal pulses.     Heart sounds: Normal heart sounds.  Pulmonary:     Effort: Pulmonary effort is normal. No respiratory distress.     Breath sounds: Normal breath sounds. No stridor. No wheezing, rhonchi or rales.  Abdominal:     General: Bowel sounds are normal.      Palpations: Abdomen is soft.     Tenderness: There is no abdominal tenderness.  Musculoskeletal:     Cervical back: Normal range of  motion and neck supple.  Skin:    General: Skin is warm and dry.  Neurological:     General: No focal deficit present.     Mental Status: She is alert and oriented to person, place, and time.  Psychiatric:        Mood and Affect: Mood normal.        Behavior: Behavior normal.        Thought Content: Thought content normal.        Judgment: Judgment normal.      UC Treatments / Results  Labs (all labs ordered are listed, but only abnormal results are displayed) Labs Reviewed  POCT INFLUENZA A/B - Abnormal; Notable for the following components:      Result Value   Influenza B, POC Positive (*)    All other components within normal limits  POC SOFIA SARS ANTIGEN FIA    EKG   Radiology No results found.  Procedures Procedures (including critical care time)  Medications Ordered in UC Medications - No data to display  Initial Impression / Assessment and Plan / UC Course  I have reviewed the triage vital signs and the nursing notes.  Pertinent labs & imaging results that were available during my care of the patient were reviewed by me and considered in my medical decision making (see chart for details).  COVID test was negative, influenza test was positive for influenza B.  Patient's symptoms have been present for the past 3 days, she is out of the window to begin Tamiflu.  Symptomatic treatment provided with Promethazine  DM for the cough and fluticasone  50 mcg nasal spray for nasal congestion or runny nose.  Supportive care recommendations were provided discussed with the patient and her family to include fluids, rest, over-the-counter analgesics, use of normal saline nasal spray, and use of a humidifier during sleep.  Discussed indications with patient's family regarding follow-up.  Patient and family were in agreement with this plan of care and  verbalized understanding.  All questions were answered.  Patient stable for discharge.  Final Clinical Impressions(s) / UC Diagnoses   Final diagnoses:  Influenza B     Discharge Instructions      The COVID test was negative, the influenza test was positive for influenza B.  As discussed, you are out of the window to begin Tamiflu. Take medication as prescribed. Increase fluids and allow for plenty of rest. You may take over-the-counter Tylenol as needed for pain, fever, or general discomfort. Recommend the use of normal saline nasal spray throughout the day for nasal congestion or runny nose. For your cough, you may find it helpful to use a humidifier in your bedroom at nighttime during sleep or to sleep elevated on pillows while symptoms persist. Symptoms should begin to improve over the next 5 to 7 days.  If symptoms fail to improve, or begin to worsen, you may follow-up in this clinic or with your primary care physician for further evaluation. Follow-up as needed.      ED Prescriptions     Medication Sig Dispense Auth. Provider   promethazine -dextromethorphan (PROMETHAZINE -DM) 6.25-15 MG/5ML syrup Take 5 mLs by mouth 4 (four) times daily as needed. 118 mL Leath-[name], Etta PARAS, NP   fluticasone  (FLONASE ) 50 MCG/ACT nasal spray Place 2 sprays into both nostrils daily. 16 g Leath-[name], Etta PARAS, NP      PDMP not reviewed this encounter.    [1]  Social History Tobacco Use   Smoking status: Never   Smokeless  tobacco: Never  Vaping Use   Vaping status: Never Used  Substance Use Topics   Alcohol use: No    Alcohol/week: 0.0 standard drinks of alcohol   Drug use: No     Gilmer Etta PARAS, NP 08/08/24 1548  "

## 2024-08-08 NOTE — Discharge Instructions (Signed)
 The COVID test was negative, the influenza test was positive for influenza B.  As discussed, you are out of the window to begin Tamiflu. Take medication as prescribed. Increase fluids and allow for plenty of rest. You may take over-the-counter Tylenol as needed for pain, fever, or general discomfort. Recommend the use of normal saline nasal spray throughout the day for nasal congestion or runny nose. For your cough, you may find it helpful to use a humidifier in your bedroom at nighttime during sleep or to sleep elevated on pillows while symptoms persist. Symptoms should begin to improve over the next 5 to 7 days.  If symptoms fail to improve, or begin to worsen, you may follow-up in this clinic or with your primary care physician for further evaluation. Follow-up as needed.

## 2024-08-08 NOTE — ED Triage Notes (Signed)
 Pt states cough,runny nose,headache and chest tightness for the past 3 days.

## 2024-08-24 ENCOUNTER — Encounter: Payer: Self-pay | Admitting: Adult Health

## 2024-08-24 ENCOUNTER — Ambulatory Visit: Admitting: Adult Health

## 2024-08-24 VITALS — BP 137/86 | HR 90 | Ht 59.5 in | Wt 175.5 lb

## 2024-08-24 DIAGNOSIS — Z3046 Encounter for surveillance of implantable subdermal contraceptive: Secondary | ICD-10-CM | POA: Diagnosis not present

## 2024-08-24 DIAGNOSIS — R232 Flushing: Secondary | ICD-10-CM | POA: Diagnosis not present

## 2024-08-24 DIAGNOSIS — Z780 Asymptomatic menopausal state: Secondary | ICD-10-CM | POA: Insufficient documentation

## 2024-08-24 NOTE — Patient Instructions (Addendum)
Use condoms, keep clean and dry x 24 hours, no heavy lifting, keep steri strips on x 72 hours, Keep pressure dressing on x 24 hours. Follow up prn problems.  

## 2024-08-24 NOTE — Addendum Note (Signed)
 Addended by: Gary Bultman A on: 08/24/2024 12:26 PM   Modules accepted: Level of Service

## 2024-08-24 NOTE — Progress Notes (Addendum)
" °  Subjective:     Patient ID: [name], female   DOB: 1970-11-14, 54 y.o.   MRN: [id number]  HPI [name] is a 54 year old Hispanic female, married, G6P6, in for nexplanon  removal. She has not a period in over 3 years and is having some hot flashes. She has interpreter with her.   Last pap was with PCP about 2 years ago PCP is Dr Waylan  Review of Systems For Nexplanon  removal No period in over 3 years +hot flashes Reviewed past medical,surgical, social and family history. Reviewed medications and allergies.     Objective:   Physical Exam BP 137/86 (BP Location: Left Arm, Patient Position: Sitting, Cuff Size: Normal)   Pulse 90   Ht 4' 11.5 (1.511 m)   Wt 175 lb 8 oz (79.6 kg)   BMI 34.85 kg/m     NEXPLANON  REMOVAL Consent signed and time out called  Right  arm cleansed with betadine, and injected with 1.5 cc 2% lidocaine and waited til numb.Under sterile technique a #11 blade was used to make small vertical incision, and a curved forceps was used to easily remove rod. Steri strips applied. Pressure dressing applied.   [facility] - 08/24/24 1152       Pregnancy Intention Screening   Does the patient want to become pregnant in the next year? No    Does the patient's partner want to become pregnant in the next year? No      Contraception Wrap Up   Current Method Hormonal Implant    End Method Female Condom;Abstinence    Contraception Counseling Provided Yes          Assessment:     1. Encounter for Nexplanon  removal (Primary) Nexplanon  removed Use condoms, keep clean and dry x 24 hours, no heavy lifting, keep steri strips on x 72 hours, Keep pressure dressing on x 24 hours. Follow up prn problems.   2. Hot flashes Will check FSH about 09/21/24 - Follicle stimulating hormone  3. Menopause Will check FSH about 09/21/24 to see if PM yet No sex or use condoms til after results back - Follicle stimulating hormone    Make sure daughter on HIPAA form and will call  her with results of [facility]  Plan:     Get pap with PCP Get mammogram per PCP Follow up prn     "
